# Patient Record
Sex: Male | Born: 2000
Health system: Southern US, Community
[De-identification: ages and names within clinical notes are randomized; demographics above are authoritative.]

---
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
## 2019-02-25 DIAGNOSIS — Z1159 Encounter for screening for other viral diseases: Secondary | ICD-10-CM | POA: Diagnosis not present

## 2019-04-09 DIAGNOSIS — N50811 Right testicular pain: Secondary | ICD-10-CM | POA: Diagnosis not present

## 2019-04-09 DIAGNOSIS — N50819 Testicular pain, unspecified: Secondary | ICD-10-CM | POA: Diagnosis not present

## 2019-04-09 DIAGNOSIS — N453 Epididymo-orchitis: Secondary | ICD-10-CM | POA: Diagnosis not present

## 2019-04-09 DIAGNOSIS — N452 Orchitis: Secondary | ICD-10-CM | POA: Diagnosis not present

## 2019-06-21 DIAGNOSIS — M79644 Pain in right finger(s): Secondary | ICD-10-CM | POA: Diagnosis not present

## 2019-06-21 DIAGNOSIS — S63619A Unspecified sprain of unspecified finger, initial encounter: Secondary | ICD-10-CM | POA: Diagnosis not present

## 2019-08-07 DIAGNOSIS — Z7251 High risk heterosexual behavior: Secondary | ICD-10-CM | POA: Diagnosis not present

## 2019-08-07 DIAGNOSIS — Z114 Encounter for screening for human immunodeficiency virus [HIV]: Secondary | ICD-10-CM | POA: Diagnosis not present

## 2019-08-07 DIAGNOSIS — Z1159 Encounter for screening for other viral diseases: Secondary | ICD-10-CM | POA: Diagnosis not present

## 2019-08-07 DIAGNOSIS — Z202 Contact with and (suspected) exposure to infections with a predominantly sexual mode of transmission: Secondary | ICD-10-CM | POA: Diagnosis not present

## 2019-09-01 DIAGNOSIS — B3742 Candidal balanitis: Secondary | ICD-10-CM | POA: Diagnosis not present

## 2019-09-01 DIAGNOSIS — A749 Chlamydial infection, unspecified: Secondary | ICD-10-CM | POA: Diagnosis not present

## 2019-09-05 ENCOUNTER — Ambulatory Visit: Payer: Self-pay | Attending: Internal Medicine

## 2019-09-05 DIAGNOSIS — Z23 Encounter for immunization: Secondary | ICD-10-CM | POA: Insufficient documentation

## 2019-09-05 NOTE — Progress Notes (Signed)
   Covid-19 Vaccination Clinic  Name:  ESCO JOSLYN    MRN: [id number] DOB: [birth year]  09/05/2019  Mr. Bauer was observed post Covid-19 immunization for 15 minutes without incidence. He was provided with Vaccine Information Sheet and instruction to access the V-Safe system.   Mr. Printup was instructed to call 911 with any severe reactions post vaccine: Marland Kitchen Difficulty breathing  . Swelling of your face and throat  . A fast heartbeat  . A bad rash all over your body  . Dizziness and weakness    Immunizations Administered    Name Date Dose VIS Date Route   Pfizer COVID-19 Vaccine 09/05/2019  6:15 PM 0.3 mL 07/01/2019 Intramuscular   Manufacturer: [employer], [employer]   Lot: [id number]   NDC: [address]-6

## 2019-09-19 DIAGNOSIS — H52223 Regular astigmatism, bilateral: Secondary | ICD-10-CM | POA: Diagnosis not present

## 2019-09-27 ENCOUNTER — Ambulatory Visit: Payer: Self-pay | Attending: Internal Medicine

## 2019-09-27 DIAGNOSIS — Z23 Encounter for immunization: Secondary | ICD-10-CM | POA: Insufficient documentation

## 2019-09-27 NOTE — Progress Notes (Signed)
   Covid-19 Vaccination Clinic  Name:  BOSTEN NEWSTROM    MRN: [id number] DOB: May 23, 2001  09/27/2019  Mr. Carstens was observed post Covid-19 immunization for 15 minutes without incident. He was provided with Vaccine Information Sheet and instruction to access the V-Safe system.   Mr. Tortorella was instructed to call 911 with any severe reactions post vaccine: Marland Kitchen Difficulty breathing  . Swelling of face and throat  . A fast heartbeat  . A bad rash all over body  . Dizziness and weakness   Immunizations Administered    Name Date Dose VIS Date Route   Pfizer COVID-19 Vaccine 09/27/2019  3:49 PM 0.3 mL 07/01/2019 Intramuscular   Manufacturer: [employer], [employer]   Lot: [id number]   NDC: [address]-5

## 2019-09-28 ENCOUNTER — Ambulatory Visit: Payer: Self-pay

## 2020-02-17 ENCOUNTER — Ambulatory Visit (INDEPENDENT_AMBULATORY_CARE_PROVIDER_SITE_OTHER): Payer: 59 | Admitting: Psychologist

## 2020-02-17 ENCOUNTER — Encounter: Payer: Self-pay | Admitting: Psychologist

## 2020-02-17 ENCOUNTER — Other Ambulatory Visit: Payer: Self-pay

## 2020-02-17 DIAGNOSIS — F4320 Adjustment disorder, unspecified: Secondary | ICD-10-CM

## 2020-02-17 NOTE — Progress Notes (Signed)
Patient ID: KYROS SALZWEDEL, male   DOB: [birth year], 19 y.o.   MRN: [id number] Psychological intake 11:05 AM to 11:55 AM with mother via video conference.  Virtual Visit via Video Note  I connected with Demetre B Vacha's mother on 02/17/20 at 11:00 AM EDT by a video enabled telemedicine application and verified that I am speaking with the correct person using two identifiers.  Location: Patient: Home Provider: [facility] office   I discussed the limitations of evaluation and management by telemedicine and the availability of in person appointments. The patient expressed understanding and agreed to proceed.  History of Present Illness: Bradden is a 19 year old adolescent male who has struggled emotionally and academically since February/March last year.  Was a Printmaker at [employer], however, due to [employer] restrictions all classes were all online.  He struggled tremendously academically only passing classes for 6 credits out of 18 credit hours he attempted.  He withdrew for spring semester and attended [employer], initially taking 3 classes, all of which he dropped.  Mood has been variable ranging from mild dysphoria and anxiety to irritability.  Motivation, energy, effort have been diminished.  He plans to reenter all at [employer] for this coming fall.  There is a concern that he may be struggling with an underlying adjustment disorder and/or mild learning disorder.  Historically, he struggled with math and science classes in high school.  He graduated from [employer] where he attended for his 11th and 12th grade years after moving here from [place].  Brief medical history: Per mother, he is not currently on any medications.  She reported that he had not had any surgeries, hospitalizations, or head injuries.  She reported no known allergies to medications, foods, and fibers.  He does have mild environmental allergies that respond well to over-the-counter medication.  There is no  history of psychotherapy or psych testing.  No history of chronic illnesses.  Family medical history is positive for mood disorder, schizophrenia, and autistic spectrum disorder in second cousins only.  Mental status: Per mother, his typical mood is fairly happy-go-lucky, although lately his mood has been quite variable ranging from irritability to what she describes as borderline depressed and anxious.  Affect is described as broad and appropriate to mood.  Speech is described as goal-directed and the content as productive..  Thoughts are described as clear, coherent, relevant and rational.  Judgment and insight are described as variable relative to age.  There have been several instances of sneaking out in the middle of the night of the rendezvous with his new girlfriend.  Mother reported no history of or concerns about suicidal or homicidal ideation.  She reported no history of drug or alcohol use.  He does have a history of vaping in the past although it is believed that he no longer is a nicotine user.  Extracurriculars in the past that included basketball and tennis.  His original goal at [employer] was to be a premed major, although now he is thinking of changing that possibly public health.  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  Social relationships are described as restricted since his relationship with his girlfriend has blossomed.  Diagnoses: Adjustment disorder unspecified, rule out learning disorder    I discussed the assessment and treatment plan with the patient. The patient was provided an opportunity to ask questions and all were answered. The patient agreed with the plan and demonstrated an understanding of  the instructions.   The patient was advised to call back or seek an in-person evaluation if the symptoms worsen or if the condition fails to improve as anticipated.  I provided 45 minutes of non-face-to-face time during this encounter.   Beatrix Fetters, PhD

## 2020-02-20 ENCOUNTER — Other Ambulatory Visit: Payer: Self-pay

## 2020-02-20 ENCOUNTER — Ambulatory Visit (INDEPENDENT_AMBULATORY_CARE_PROVIDER_SITE_OTHER): Payer: 59 | Admitting: Psychologist

## 2020-02-20 ENCOUNTER — Encounter: Payer: Self-pay | Admitting: Psychologist

## 2020-02-20 DIAGNOSIS — F4320 Adjustment disorder, unspecified: Secondary | ICD-10-CM

## 2020-02-20 NOTE — Progress Notes (Signed)
Patient ID: Paul Rosario, male   DOB: November 17, 2000, 19 y.o.   MRN: [id number] Psychological testing 1:30 PM to 4:30 PM +1-hour for scoring.  Administered the Wechsler Adult Intelligence Scale-four, Paul Rosario did a reading test, the portions of the Woodcock-Johnson achievement battery.  I will complete the evaluation tomorrow and provide feedback and recommendations to patient and parents.  Diagnoses: Adjustment disorder unspecified, rule out learning disorder

## 2020-02-21 ENCOUNTER — Encounter: Payer: Self-pay | Admitting: Psychologist

## 2020-02-21 ENCOUNTER — Ambulatory Visit (INDEPENDENT_AMBULATORY_CARE_PROVIDER_SITE_OTHER): Payer: 59 | Admitting: Psychologist

## 2020-02-21 DIAGNOSIS — F4320 Adjustment disorder, unspecified: Secondary | ICD-10-CM

## 2020-02-21 NOTE — Progress Notes (Signed)
Patient ID: Paul Rosario, male   DOB: 07-23-2000, 19 y.o.   MRN: [id number] Psychological testing 9 AM to 11 AM +2 hours for report. Completed the Woodcock-Johnson achievement test battery, Wide Range Assessment of Memory and Learning, ADHD and mood rating scales, and Conners continuous performance test. I will conference with patient and parents to discuss results and recommendations.  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

## 2020-02-21 NOTE — Progress Notes (Addendum)
Patient ID: Paul Rosario, male   DOB: 2000/10/10, 19 y.o.   MRN: [id number] Psychological testing feedback session 11 AM to 11:50 AM with patient and mother. Discussed results of the psychological evaluation. In the Wechsler Adult Intelligence Scale-4, Paul Rosario performed in the superior range of intellectual functioning and at the 91st percentile. His academic skills are at levels consistent with intellectual aptitude. ADHD and mood rating scales were all in the nonclinical range. However, he did display global mild neurodevelopmental dysfunctions in auditory memory, visual memory, and working memory. Numerous recommendations and accommodations were discussed. A report will be prepared that can be shared with the appropriate school personnel.  Diagnoses: Adjustment disorder unspecified, mild neurodevelopmental dysfunctions and memory         PSYCHOLOGICAL EVALUATION  NAME:   Paul Rosario  DATE OF BIRTH:   02-Oct-2000 AGE:   19 years, 7 months  GRADE:   Rising college sophomore  DATES EVALUATED:   02-20-20, 02-21-20 EVALUATED BY:   Beatrix Fetters. Mark Saniya Tranchina, Ph.D.   MEDICAL RECORD NO.: [id number]   REASON FOR REFERRAL:   Saxton was referred for an evaluation of his cognitive, intellectual, academic, memory, and attention strengths/weaknesses to aid in academic planning.  The reader interested in more background information is referred to the medical record where there is a comprehensive developmental database.  BASIS OF EVALUATION: Wechsler Adult Intelligence Scale-IV Woodcock-Johnson IV Tests of Achievement Nelson-Denny Reading Test Form J Wide-Range Assessment of Memory and Learning-II Conners Continuous Performance Test-3 ADHD, Depression, and Anxiety Rating Scales   RESULTS OF THE EVALUATION: On the Wechsler Adult Intelligence Scale-Fourth Edition (WAIS-IV), Paul Rosario achieved a General Ability Index standard score of 120 and a percentile rank of 91.  These data indicate that he is currently functioning in  the superior range of intelligence.  The General Ability Index is deemed the most valid and reliable indicator of [name]'s current level of intellectual functioning given the scatter among the individual indices.  Raine's index scores and scaled scores are as follows:   Domain                        Standard Score    Percentile Rank Verbal Comprehension Index          120 91 Perceptual Reasoning Index             113 81 Working Memory Index                   105 63 Processing Speed Index                      94 34 Full Scale IQ                                     112 79 General Ability Index                        120 91   Verbal    Comprehension Subtests Scaled Score             Similarities 13  Vocabulary 13  Information 13   Working    MusicianMemory Subtests  Scaled Score               Digit Span 8  Arithmetic  14   Perceptual  Reasoning Subtests  Scaled Score Block Design  14 Matrix Reasoning 12  Processing  Speed Subtests  Scaled Score Coding 8 Symbol Search 10  * Please note, all scaled scores have a mean of 10 and a standard deviation of three.    On the Verbal Comprehension Index, Paul Rosario performed in the superior range of intellectual functioning and at the 91st percentile.  Overall, he displayed an exceptional ability to access and apply acquired knowledge.  He was able to verbalize meaningful concepts, think about verbal information, and express himself using words with complete ease.  Paul Rosario's high scores in this domain are indicative of a superior verbal reasoning system with strong word knowledge acquisition, effective information retrieval, superior ability to reason and solve verbal problems, and effective communication of knowledge.  Paul Rosario performed comparably across all three subtests from this domain, indicating that this verbal abstract reasoning skills, word knowledge/vocabulary skills, and fund of general knowledge/long-term memory for factual information are all similarly  well developed at this time.    On the Perceptual Reasoning Index, Paul Rosario performed in the above average range of intellectual functioning and at the 81st percentile.  Overall, he displayed a well developed ability to evaluate visual details and understand visual spatial relationships.  The data indicate that he has above average broad visual intelligence, abstract visual thinking capacity, and ability to apply spatial reasoning and analyze visual details.  Paul Rosario performed comparably across the different subtests from this domain, indicating that his visual spatial reasoning ability is similarly well developed, whether solving visual problems that involve a motor response, or solving visual problems with unique stimuli that must be solved mentally.    On the Working Memory Index, Paul Rosario performed in the average range of functioning and at the 63rd percentile.  However, his performance across the different subtests in this domain was extremely discrepant and diagnostically important.  For example, Paul Rosario performed in the superior range of functioning and at the 91st percentile, on the arithmetic subtest, which measures his ability to answer basic mathematical word problems in his head.  On the other hand, Paul Rosario displayed a mild neurodevelopmental dysfunction, and functional limitation/deficit, at the very lowest end of the average range of functioning, and at only the 25th percentile, in his ability to register, maintain, and manipulate auditory information in conscious awareness.  Paul Rosario struggled to remember one piece of verbal information while performing a second mental or cognitive task.  Paul Rosario's auditory working memory skills are one of his weakest areas of cognitive development.    On the Processing Speed Index, Paul Rosario performed toward the lowest end of the average range of functioning and at only the 34th percentile.  He displayed a mild functional deficit in his speed and accuracy in visual identification,  decision making, and decision implementation.  While Paul Rosario's mental/cognitive processing skills were within the broad parameters of average, they represent one of his weakest areas of cognitive development and should be considered at least a relative area of weakness.  Dajon was inconsistent in his ability to rapidly identify, register, and implement decisions under time pressures, particularly when there was a graphomotor component.    On the General Ability Index, Binyamin performed in the superior range of intellectual functioning and at the 91st percentile.  The General Ability Index provides an estimate of overall intelligence that is less impacted by working memory and processing speed, especially relative to the Full Scale IQ score.  The General Ability Index consists of subtests from the verbal comprehension and perceptual reasoning domains.  Nakota's high General Ability Index  scores indicate superior abstract, conceptual, visual perceptual and spatial reasoning, as well as verbal problem solving ability.  There was a significant difference between Bartley's General Ability Index and Full Scale IQ scores indicating that the effects of working memory and cognitive processing speed definitely led to his relatively lower overall Full Scale IQ.  That is, the estimate of Kalum's intellectual ability was lowered by the inclusion of working memory and processing speed subtests.  These data further support the conclusion that his higher-order cognitive abilities are a distinct area of strength, while his working memory and processing speed skills are specific areas of weakness.    On the Woodcock-Johnson IV Tests of Achievement, Nox achieved the following scores using norms based on his age:         Standard Score  Percentile Rank Basic Reading Skills 109 72    Letter-Word Identification 105 64    Word Attack 112 80  Reading Comprehension Skills 112 78   Passage Comprehension 109 74   Reading Recall   111 76  Math Calculation Skills 119 90   Calculation 115 84   Math Facts Fluency 118 88  Math Problem Solving 123 94   Applied Problems 128 97   Number Matrices 114 83  Written Expression 124 95   Writing Samples 138 99   Sentence Writing Fluency 96 39  Academic Fluency 108 71    Sentence Reading Fluency 104 59    Math Facts Fluency 118 89    Sentence Writing Fluency 96 39  On the reading portion of the achievement test battery, Danielle performed overall in the above average range of functioning and well above age and grade level.  For example, he displayed above average word decoding skills (sight word recognition, phonological processing), reading comprehension and reading recall.  Yardley did display one mild weakness, albeit still solidly average, and on age and grade level, in his reading processing speed/fluency.  To further assess Weylin's reading comprehension abilities under time pressures, the Nelson-Denny Reading Test, Form J was administered.  On this test, Breton achieved a Reading Comprehension standard score of 115 and a percentile rank of 84, and a Reading Rate standard score of 83 and a percentile rank of 33.  These data indicate while that Vayden's reading processing speed/fluency is slow, his comprehension is well above average.     On the math portion of the achievement test battery, Elvis performed for the most part in the superior range of functioning and substantially above age and grade level.  Nnamdi displayed an excellent knowledge of basic math facts and basic calculation skills.  Further, Romulo displayed a strength, in the superior to very superior range of functioning, in his math reasoning ability.  He intuitively understands math concepts at an exceptionally high level.  He was able to deconstruct multioperational word problems with ease and generalize math concepts with ease.    On the written language portion of the achievement test battery, Dillinger's performance across  the two subtests was quite discrepant.  On the one hand, when there were no time pressures, Qasim displayed very superior and gifted writing composition ability.  His compositions were thoughtful, cogent, comprehensive, and filled with creative detail.  On the other hand, Tremaine displayed a mild neurodevelopmental dysfunction, toward the lower end of the average range of functioning, and a full 4+ grade levels behind (grade equivalent 8.8) in his writing processing speed/fluency.  It does take Gerson significantly longer to write under time pressures than one  would expect given his intellectual aptitude.     On the Wide-Range Assessment of Memory and Learning-II, Froilan achieved the following scores:   Verbal Memory Standard Score: 94  Percentile Rank: 34   Visual Memory Standard Score: 100  Percentile Rank: 50  These data indicate that while Therman's overall auditory and visual memory skills are in the average range of functioning, they are well below his intellectual aptitude, and should be considered areas of weakness.  In the auditory realm, Aydon was very inconsistent in his ability to remember details from stories and word lists that were read to him.  In the visual realm, Hadi was very inconsistent with both his visual recall and visual recognition memory.  Further, as previously noted, Aristeo displayed a mild neurodevelopmental dysfunction in his auditory working memory.  To maximize his cognitive, intellectual, and academic potential, Spyros is going to need to learn and utilize specific study and memory strategies to compensate for this relative weakness.    Results from the Conners Continuous Performance Test were in the nonclinical range of functioning.  Winson did not have any atypical T-scores and the results do not suggest that he has a disorder characterized by attention deficits, such as ADHD.  Further, results from the ADHD Rating Scales indicate that Tricia did not meet any of the inattention  of hyperactive/impulsive subtype criteria.    Results from the Depression and Anxiety Rating Scales were in the nonclinical range of functioning.  The data do not suggest that Broady is struggling with any clinically significant issues with depression or anxiety at this time.    SUMMARY: In summary, the data indicate that Thelmer is a young man of superior intellectual aptitude.  Overall, he displayed well developed abstract, conceptual, visual perceptual and spatial reasoning, as well as verbal problem solving ability.  Academically, the data, for the most part, indicate that Linc is performing substantially above both age and grade level.  He displayed strengths, in the above average range of functioning, in his basic reading skills, reading comprehension ability, and basic calculation skills.  Further, he displayed superior to very superior math reasoning ability, and very superior writing composition skills.  Results from the ADHD, depression and anxiety rating scales do not indicate that Niraj is struggling with any clinically significant issues in these areas at this time.  On the other hand, the data do yield several areas of at least mild concern.  First, Coltyn displayed mild neurodevelopmental dysfunctions in his memory skills.  His auditory working auditory, Engineer, production, visual recognition, and visual recall memory skills are all inconsistent and well below intellectual functioning.  Second, Carlyle displayed a mild neurodevelopmental dysfunction, and mild functional limitation in his cognitive processing speed.  Finally, Lucky displayed a mild neurodevelopmental dysfunction in his writing processing speed/fluency.    DIAGNOSTIC CONCLUSIONS: 1. Superior Intelligence.   2. 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  3. Mild neurodevelopmental dysfunction in processing speed and writing fluency.  4. Academic skills consistent with intellectual aptitude.  RECOMMENDATIONS:   1. It  is recommended that the results of this evaluation be shared with Demitrios's academic team so that they are aware of the pattern of his cognitive, intellectual, academic, and memory strengths/weaknesses.  Given the constellation of Chen's mild neurodevelopmental dysfunctions in memory, cognitive processing speed, and writing processing speed/fluency, it is recommended that he receive extended time on tests as necessary, a set of class/lecture notes, access to digital technology (laptop or similar device, Smart Pen, voice to text  capacity, etc.).    2. Following are general suggestions regarding Avrum's mild functional deficits in memory:  A. Zayvien should use mnemonic strategies to help improve his memory skills.  For example, he should remember information via imagery, rhymes, anagrams, or subcategorization.   B. It is recommended that Jonpaul be allowed to record his classroom lectures.  He could then relisten to the academic lecture at home to help supplement any gaps in his knowledge that might have occurred due to his memory weaknesses.  The [employer] Pen is an excellent option.  C. It is important that Hermilo study in a quiet environment with a minimal amount of noise and distractions present.  He should not study in situations where music is playing, the TV is on, or other people are talking nearby.    D. Other study/memory strategies to be utilize:  1.  Complete all assignments.  This includes not just doing and turning in the  homework but also reading all the assigned text.  Homework assignments are a professor's gift to students, a free grade.  Do not give away free grades.   2.   Spend minimum of 15-30 minutes reviewing notes for each class per day.                       3.   In class, sit near the front.  This reduces distractions and increases    attention.                            4.   For tests be selective and study in depth.  Spend a minimum of 1-2 hours    daily reviewing  your test material starting 3 days before each test.                E. Maximize your memory:  Following are memory techniques:  . To improve memory increases the number of rehearsals and the input channels.  For example, get in the habit of Hearing the information, Seeing the information, Writing the information, and Explaining out loud that information.  . Over learn information.  . Make mental links and associations of all materials to existing knowledge so that you give the new material context in your mind.  . Systemize the information.  Always attempt to place material to be learned in some form of pattern.  Create a system to help you recall how information is organized and connected.  . Review is key.  Review very soon after the original learning and then space out additional review periods farther apart.  The best time to review is just as you are about to forget, but can still just remember.   F. Time Management:  Always stop studying at a reasonable hour (i.e.:  10-11    p.m.).  It is important that Timithy study for 20-45 minutes at a time then take a 5-   10 minute break.  3. Following are general study strategies to help Izrael maximize his cognitive, intellectual, and academic potential:   A. The best way to begin any reading assignment is to skim the pages to get an  overall view of what information is included.  Then read the text carefully, word for word, and highlight the text and/or take notes in your notebook.                B. Thoma should participate actively while  reading and studying.  For example, he needs to acquire the habit of writing while he reads, learning to underline, to circle key words, to place an asterisk in the margin next to important details, and to inscribe comments in the margins when appropriate.  These habits over time will help Delwyn read for content and should improve his comprehension and recall.     C. Alonzo should practice reading by breaking up  paragraphs into specific meaningful  components.  For example, he should first read a paragraph to discern the main idea, then, on a separate sheet of paper, he should answer the questions who, what, where, when, and why.  Through this type of practice, Fitzroy should be able to learn to read and select salient details in passages while being able to reject the less relevant content details.  Additionally, it should help him to sequence the passage ideas or events into a logical order and help him differentiate between main ideas and supporting data.  Once Able has completed the process mentioned above, he should then practice re-telling and re-thinking the passage and its meaning into his own words.     D. In order to improve his comprehension, Qasim is encouraged to use the    following reading/study skills:  1. Before reading a passage or chapter, first skim the chapter heading and bold face material to discern the general gist of the material to be read.  2. Before reading the passage or chapter, read the end-of-chapter questions to determine what material the authors believe is important for the student to remember.  Next, write those questions down on a separate piece of paper to be answered while reading.    E. When reading to study for an examination, Deavon needs to develop a deliberate    memory plan by considering questions such as the following:    1. What do I need to read for this test?  2. How much time will it take for me to read it?  3. How much time should I allow for each chapter section?  4. Of the material I am reading, what do I have to memorize?  5. What techniques will I use to allow materials to get into my memory?  This is where underlining, writing comments, or making charts and diagrams can strengthen reading memory.  6. What other tricks can I use to make sure I learn this material:  Should I use a tape recorder?  Should I try to picture things in my mind?  Should I  use a great deal of repetition?  Should I concentrate and study very hard just before I go to sleep?    7. How will I know when I know?  What self-testing techniques can I use to test my knowledge of the material?   F. It is recommended that Nickolis use a multicolored highlighter to highlight  material.  For example, he could highlight main ideas in yellow, names and dates in green, and supporting data in pink.  This technique provides visual cues to aid with memory and recall.       G. READING MARGIN NOTES:        1. Underline important ideas you want to remember, and then write a key   word or draw a picture or symbol in the margin.  You should also underline and then write "Main Idea" or "MI" in margin.      2. Write a note or draw a picture or  diagram in the margin that describes the   organizational structure the Thereasa Parkin uses such as:  cause/effect, compare/contrast, temporal/sequential order.      3. Write numbers beside supporting details in the text and in the margin write       "SD" and the corresponding number, i.e., SD-1, SD-2, etc.      4. Write "EX" in the margin to indicate when the Thereasa Parkin gives examples of       main ideas.      5. Circle unknown words and terms and write definition in margin.      6. Write any ideas or questions you have about the subject in the margin.        Relating information in the text to what you already know and your own       experience helps you understand and remember.      7. Star or otherwise emphasize ideas or facts in the text that your teacher       talks about in class.  These are likely to be used in test questions.      8. Put a question mark beside any parts of the text or ideas which you have   trouble understanding as a reminder to ask about them or look up more information.      9. Whether you write words or draw pictures or symbols does not matter.        The purpose is to remind you what is important and/or what needs further    clarification.  Use the system that works best for you.  It will help to be consistent and use the same system for all subjects.    1. Do not go on to the next chapter or section until you have completed the following exercise:  2. Write definitions of all key terms.  3. Summarize important information in your own words.  4. Write any questions that will need clarification with the teacher.   H. Read With a Plan:  Zephan's plan should incorporate the following:   1. Learn the terms.   2. Skim the chapter.   3. Do a thorough analytical reading.   4. Immediately upon completing your thorough reading, review.   5. Write a brief summary of the concepts and theories you need to    remember.     I. Karandeep should use [employer] One Note to record his homework assignments   for each class.  He should notate that he completed each assignment and that he put each assignment in its proper place to be turned in on time.  J. Know the Professors:  Nyshawn should make an effort to understand each  professor's approach to their subjects, their expectations, standards, flexibility, etc.  Essentially, Reda should compile a mental profile of each professor and be able to answer the questions:  What does this professor want to see in terms of notes, level of participation, papers, projects?  What are the professor's likes and dislikes?  What are the professor's methods of grading and testing?, etc.  K. Note Taking:  Orpheus should compile notes in two different arenas.  First, Tobyn should take notes from his textbooks.  Working from his books at home or in [employer], Murle should identify the main ideas, rephrase information in his own words, as well as capture the details in which he is unfamiliar.  He should take brief, concise notes in a separate computer notebook for each class.  Second, in class, Sevyn should take notes that sequentially follow the professor's lecture pattern.  When class is complete,  Charleton should review his notes at the first opportunity.  He can then fill in any gaps or missing information either by tracking down that information from the textbook, from the professor, or utilizing a copy of professor notes.  L. Organize Your Time:  While it is important to specifically structure study time,  it is just as important to understand that one must study when one can and study whenever circumstances allow.  Initially, always identify those items on your daily calendar, that can be completed in 15 minutes or less.  These are the items that could be set aside to be completed during lunch, between text messages, etc.  It is recommended that Gerell use two tools for his daily planning organization.  First is [employer] One Note.  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  On the project board, Nosson should schedule all of his long-term projects, papers, and scheduled tests/exams.  One important trick, when scheduling the due dates, it is recommended that Kawon always schedule the completion date at least 2-3 days prior to the actual turn in date so as to give Kendrell a cushion for life circumstances as they arise.  With each paper, test and long term project then work backwards on the project board filling in what needs to be done week by week until completion (i.e.:  first draft, second draft, proofing, final draft and turn in).              M. The amount you learn, or the amount you write is directly related to the amount of   time you spend doing it.  If you want to be successful (maximize your grades, for instance), you will need to set aside time to work.  Following are some fundamentals of effective study:    1. Create a good and inviting work environment.  Try to keep a specific place  to study, make it appealing in your own way, and keep it clear of clutter and distractions.     2. Make a list beforehand of what you  are going to work on.  List what you  are going to do, in what order you are going to do them, and the amount of time you plan to spend on each.  You can make "game time" changes as needed.    3. Keep the benefits of your study clearly in mind.  Remind yourself what the     end goal is and how this study moment contributes to it.    4. Always leave your study environment organized for the next session.  Put     papers, notes, and books where they should go.    5. Study in short periods.  Spend between 20-45 minutes at a time and then  take a short 5-10 minute break.  Use a timer to keep track of both your work time and rest time.    6. Divide big projects into individual smaller and manageable tasks.  Focus     on the demands of each smaller task one at a time.    N. Learn to be a good note taker.  Notetaking helps you organize the material,   increases your understanding and remembering of the material, and allows you to put information into your own  words.      1. Taking lecture notes and notes on what you read helps you concentrate     and stay focused.  It keeps you actively engaged.      2. Taking notes helps you to more easily remember the material.    3. Notetaking might include notes written in a linear fashion, the underlining  or highlighting of key points, making comments in the margins, the drawing of pictures/diagrams/graphs or spider diagrams.      4. It is always useful, as you get close to the exam, to rewrite and condense   your notes.  Essentially, make notes of your notes.  This helps you to rehearse the material, process the material, retrieve the material, all of which makes the information more readily accessible and easier to recall.     O. Good study habits, a motivation to learn, and a positive attitude are key factors in   determining the success or the lack of success of one's educational pursuits.  Learn to avoid some of the common roadblocks to academic  success:    1.  Lack of Discipline - One must learn to continue working toward their     goals, even when it is difficult, or stressful, or boring.  Get in the habit of  doing what you need to do, when you need to do it to the best of your ability, whether or not you like it or enjoy it.  Learn to get comfortable feeling uncomfortable.      2. Lack of Passion/Motivation - Motivation follows action.  Get busy and the  motivation will follow.  Create an image in your head of how you will feel when your goal is accomplished.      3. Lack of Focus - To counter focus issues, make a plan or a list that outlines     all the necessary steps to complete your task.  Now just complete one task     at a time until the job is done.  Knowing the steps makes the task easier.     4. Lack of Accountability - Be accountable to yourself.  Reward yourself  with task completion, withhold the reward for non-completion.  Share your goal, plan with someone else.  Sometimes it is harder to let someone else down than yourself.     5. Lack of Time - Practice breaking down tasks into 25-45 minute  intervals/segments and take a short break in between.  Shorter work spurts increase productivity.      6. Too Many Negative Thoughts - Learn how to identify those negative  thoughts that diminish productivity and work ethic.  Confront and replace them with more successful outcome thoughts.     P. Test Taking Strategies:    1. Read through the whole test first.    2. Notice how many points each part of the test is worth.    3. Write down any specific formulas, principals, ideas or other details you     have memorized in the margins.    4. Answer the easiest questions first.    5. Answer all the objective parts first (these often give you clues for the     essay questions).    6. Answer the essay questions last.  Use a mind map to help organize your     ideas.     As always, this examiner is available to consult  in the future as needed.  Respectfully,    RJolene Provost, Ph.D.  Licensed Psychologist Clinical Director [facility]  RML/tal

## 2020-04-22 DIAGNOSIS — R509 Fever, unspecified: Secondary | ICD-10-CM | POA: Diagnosis not present

## 2020-04-23 ENCOUNTER — Other Ambulatory Visit: Payer: Self-pay

## 2020-04-23 ENCOUNTER — Encounter (HOSPITAL_COMMUNITY): Payer: Self-pay | Admitting: *Deleted

## 2020-04-23 ENCOUNTER — Ambulatory Visit (HOSPITAL_COMMUNITY)
Admission: EM | Admit: 2020-04-23 | Discharge: 2020-04-23 | Disposition: A | Discharge status: Home / Self Care | Payer: 59 | Attending: Family Medicine | Admitting: Family Medicine

## 2020-04-23 ENCOUNTER — Ambulatory Visit (INDEPENDENT_AMBULATORY_CARE_PROVIDER_SITE_OTHER): Payer: 59

## 2020-04-23 ENCOUNTER — Encounter: Payer: Self-pay | Admitting: Family Medicine

## 2020-04-23 ENCOUNTER — Telehealth (INDEPENDENT_AMBULATORY_CARE_PROVIDER_SITE_OTHER): Payer: 59 | Admitting: Family Medicine

## 2020-04-23 DIAGNOSIS — R21 Rash and other nonspecific skin eruption: Secondary | ICD-10-CM

## 2020-04-23 DIAGNOSIS — R110 Nausea: Secondary | ICD-10-CM | POA: Diagnosis not present

## 2020-04-23 DIAGNOSIS — R509 Fever, unspecified: Secondary | ICD-10-CM | POA: Diagnosis not present

## 2020-04-23 DIAGNOSIS — B349 Viral infection, unspecified: Secondary | ICD-10-CM

## 2020-04-23 LAB — CBC
HCT: 42.3 % (ref 39.0–52.0)
Hemoglobin: 13.6 g/dL (ref 13.0–17.0)
MCH: 24.3 pg — ABNORMAL LOW (ref 26.0–34.0)
MCHC: 32.2 g/dL (ref 30.0–36.0)
MCV: 75.7 fL — ABNORMAL LOW (ref 80.0–100.0)
Platelets: 108 10*3/uL — ABNORMAL LOW (ref 150–400)
RBC: 5.59 MIL/uL (ref 4.22–5.81)
RDW: 12.7 % (ref 11.5–15.5)
WBC: 2.9 10*3/uL — ABNORMAL LOW (ref 4.0–10.5)
nRBC: 0 % (ref 0.0–0.2)

## 2020-04-23 LAB — POCT URINALYSIS DIPSTICK, ED / UC
Bilirubin Urine: NEGATIVE
Glucose, UA: NEGATIVE mg/dL
Ketones, ur: NEGATIVE mg/dL
Leukocytes,Ua: NEGATIVE
Nitrite: NEGATIVE
Protein, ur: NEGATIVE mg/dL
Specific Gravity, Urine: 1.01 (ref 1.005–1.030)
Urobilinogen, UA: 0.2 mg/dL (ref 0.0–1.0)
pH: 7 (ref 5.0–8.0)

## 2020-04-23 LAB — CBC WITH DIFFERENTIAL/PLATELET
Abs Immature Granulocytes: 0.03 10*3/uL (ref 0.00–0.07)
Basophils Absolute: 0 10*3/uL (ref 0.0–0.1)
Basophils Relative: 0 %
Eosinophils Absolute: 0 10*3/uL (ref 0.0–0.5)
Eosinophils Relative: 0 %
HCT: 42.4 % (ref 39.0–52.0)
Hemoglobin: 13.8 g/dL (ref 13.0–17.0)
Immature Granulocytes: 1 %
Lymphocytes Relative: 18 %
Lymphs Abs: 0.5 10*3/uL — ABNORMAL LOW (ref 0.7–4.0)
MCH: 24.4 pg — ABNORMAL LOW (ref 26.0–34.0)
MCHC: 32.5 g/dL (ref 30.0–36.0)
MCV: 75 fL — ABNORMAL LOW (ref 80.0–100.0)
Monocytes Absolute: 0.2 10*3/uL (ref 0.1–1.0)
Monocytes Relative: 5 %
Neutro Abs: 2.2 10*3/uL (ref 1.7–7.7)
Neutrophils Relative %: 76 %
Platelets: 125 10*3/uL — ABNORMAL LOW (ref 150–400)
RBC: 5.65 MIL/uL (ref 4.22–5.81)
RDW: 12.7 % (ref 11.5–15.5)
WBC: 2.9 10*3/uL — ABNORMAL LOW (ref 4.0–10.5)
nRBC: 0 % (ref 0.0–0.2)

## 2020-04-23 LAB — POCT RAPID STREP A, ED / UC: Streptococcus, Group A Screen (Direct): NEGATIVE

## 2020-04-23 MED ORDER — ACETAMINOPHEN 325 MG PO TABS
ORAL_TABLET | ORAL | Status: AC
  Filled 2020-04-23: qty 2

## 2020-04-23 MED ORDER — ACETAMINOPHEN 325 MG PO TABS
650.0000 mg | ORAL_TABLET | Freq: Once | ORAL | Status: AC
  Administered 2020-04-23: 650 mg via ORAL

## 2020-04-23 MED ORDER — ONDANSETRON HCL 4 MG PO TABS: 4.0000 mg | ORAL_TABLET | Freq: Three times a day (TID) | ORAL | 0 refills | Status: AC | PRN

## 2020-04-23 NOTE — ED Provider Notes (Signed)
[facility]    CSN: [id number] Arrival date & time: 04/23/20  1303      History   Chief Complaint Chief Complaint  Patient presents with  . Rash  . Fever  . Chills  . Headache    HPI Paul Rosario is a 19 y.o. male.   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  He is Covid vaccinated.  He has had a fever for 4 days.  Fever and chills at home.  Slight headache and body aches.  Decreased appetite.  No nausea, no vomiting, no diarrhea.  No cough, sore throat, runny nose or respiratory symptoms.  No loss of taste or smell.  He went to an urgent care yesterday, and they did Covid testing and flu testing that were both negative.  They sent him home told him he had a virus.  Father was dissatisfied because he did not get any other testing or physical examination.  No real explanation for his fever. Today he comes in with a temperature of 101.6.  He feels tired, slight headache.  Again no other symptoms.  He developed a rash today.  It does not itch  History reviewed. No pertinent past medical history.  There are no problems to display for this patient.   History reviewed. No pertinent surgical history.     Home Medications    Prior to Admission medications   Medication Sig Start Date End Date Taking? Authorizing Provider  ondansetron (ZOFRAN) 4 MG tablet Take 1 tablet (4 mg total) by mouth every 8 (eight) hours as needed for nausea or vomiting. 04/23/20   [place], Betty G, MD    Family History Family History  Problem Relation Age of Onset  . Healthy Mother   . Healthy Father     Social History Social History   Tobacco Use  . Smoking status: Never Smoker  . Smokeless tobacco: Never Used  Vaping Use  . Vaping Use: Former  . Quit date: 11/22/2019  Substance Use Topics  . Alcohol use: Never  . Drug use: Not Currently    Types: Marijuana    Comment: a while ago     Allergies   Amoxicillin   Review of  Systems Review of Systems See HPI  Physical Exam Triage Vital Signs ED Triage Vitals  Enc Vitals Group     BP 04/23/20 1607 138/78     Pulse Rate 04/23/20 1607 (!) 105     Resp 04/23/20 1607 16     Temp 04/23/20 1607 (!) 101.6 F (38.7 C)     Temp Source 04/23/20 1607 Oral     SpO2 04/23/20 1607 98 %     Weight --      Height --      Head Circumference --      Peak Flow --      Pain Score 04/23/20 1604 7     Pain Loc --      Pain Edu? --      Excl. in GC? --    No data found.  Updated Vital Signs BP 138/78 (BP Location: Right Arm)   Pulse (!) 105   Temp (!) 101.6 F (38.7 C) (Oral)   Resp 16   SpO2 98%      Physical Exam Constitutional:      General: He is not in acute distress.    Appearance: He is well-developed and normal weight. He is ill-appearing.  HENT:  Head: Normocephalic and atraumatic.     Right Ear: Tympanic membrane, ear canal and external ear normal.     Left Ear: Tympanic membrane, ear canal and external ear normal.     Nose: Nose normal. No congestion.     Mouth/Throat:     Mouth: Mucous membranes are moist.     Comments: Slight erythema of posterior pharynx Eyes:     Conjunctiva/sclera: Conjunctivae normal.     Pupils: Pupils are equal, round, and reactive to light.     Comments: No conjunctival injection  Cardiovascular:     Rate and Rhythm: Normal rate and regular rhythm.     Heart sounds: Normal heart sounds.  Pulmonary:     Effort: Pulmonary effort is normal. No respiratory distress.     Breath sounds: Normal breath sounds.     Comments: Heart and lung exam is normal Abdominal:     General: Abdomen is flat. There is no distension.     Palpations: Abdomen is soft.     Comments: No mass or organomegaly  Musculoskeletal:        General: Normal range of motion.     Cervical back: Normal range of motion and neck supple.  Lymphadenopathy:     Cervical: No cervical adenopathy.  Skin:    General: Skin is warm and dry.     Comments:  Skin is warm and damp.  Faint erythematous macular rash present both arms chest and back.  Lower extremities not examined.  See photo  Neurological:     General: No focal deficit present.     Mental Status: He is alert.  Psychiatric:        Mood and Affect: Mood normal.        Behavior: Behavior normal.        UC Treatments / Results  Labs (all labs ordered are listed, but only abnormal results are displayed) Labs Reviewed  CBC - Abnormal; Notable for the following components:   All other components within normal limits  CBC WITH DIFFERENTIAL/PLATELET - Abnormal; Notable for the following components:   WBC 2.9 (*)    MCV 75.0 (*)    MCH 24.4 (*)    Platelets 125 (*)    Lymphs Abs 0.5 (*)    All other components within normal limits  POCT URINALYSIS DIPSTICK, ED / UC - Abnormal; Notable for the following components:   Hgb urine dipstick TRACE (*)    All other components within normal limits  CULTURE, GROUP A STREP [facility])  POCT RAPID STREP A, ED / UC   Strep test is negative. Urinalysis is negative. CBC shows leukopenia with a reassuring differential Chest x-ray is normal EKG   Radiology DG Chest 2 View  Result Date: 04/23/2020 CLINICAL DATA:  Fever, chills, nausea and rash. EXAM: CHEST - 2 VIEW COMPARISON:  None. FINDINGS: The cardiomediastinal contours are normal. The lungs are clear. Pulmonary vasculature is normal. No consolidation, pleural effusion, or pneumothorax. No acute osseous abnormalities are seen. IMPRESSION: Negative radiographs of the chest. Electronically Signed   By: Narda Rutherford M.D.   On: 04/23/2020 17:11    Procedures Procedures (including critical care time)  Medications Ordered in UC Medications  acetaminophen (TYLENOL) tablet 650 mg (650 mg Oral Given 04/23/20 1618)    Initial Impression / Assessment and Plan / UC Course  I have reviewed the triage vital signs and the nursing notes.  Pertinent labs & imaging results that were available  during my care of the  patient were reviewed by me and considered in my medical decision making (see chart for details).  Clinical Course as of Apr 23 1950  [facility] Apr 23, 2020  1716 CBC [YN]    Clinical Course User Index [YN] Eustace Moore, MD    *With patient permission I spoke with his mother.  I would discussed his testing.  I told her that all indicators point to a viral illness.  Not flu or Covid.  A number of other viruses can cause this picture.  His rash does look like a viral exanthem.  We will treat conservatively and follow Final Clinical Impressions(s) / UC Diagnoses   Final diagnoses:  Viral illness     Discharge Instructions      Chest x ray and lab testing is reassuring.  The white blood count is low because of the viral infection.  The CBC needs to be repeated in 2 weeks.  Strep test is negative.  By report Covid test and flu tests are negative as well This appears to be a viral infection.  Rest, fluids, Tylenol are the only treatments at this time likely to cause benefit.  Zofran may be taken for nausea. Appetite is poor but it is okay as long as he keeps drinking Need to follow-up if not improving in 72 hours.  Return sooner if worse instead of better at any time  Results for GREGOREY, NABOR (MRN [id number]) as of 04/23/2020 18:04  Ref. Range 04/23/2020 17:05 04/23/2020 17:17  WBC Latest Ref Range: 4.0 - 10.5 K/uL  2.9 (L)  RBC Latest Ref Range: 4.22 - 5.81 MIL/uL  5.65  Hemoglobin Latest Ref Range: 13.0 - 17.0 g/dL  42.8  HCT Latest Ref Range: 39 - 52 %  42.4  MCV Latest Ref Range: 80.0 - 100.0 fL  75.0 (L)  MCH Latest Ref Range: 26.0 - 34.0 pg  24.4 (L)  MCHC Latest Ref Range: 30.0 - 36.0 g/dL  76.8  RDW Latest Ref Range: 11.5 - 15.5 %  12.7  Platelets Latest Ref Range: 150 - 400 K/uL  125 (L)  nRBC Latest Ref Range: 0.0 - 0.2 %  0.0  Neutrophils Latest Units: %  76  Lymphocytes Latest Units: %  18  Monocytes Relative Latest Units: %  5  Eosinophil Latest  Units: %  0  Basophil Latest Units: %  0  Immature Granulocytes Latest Units: %  1  NEUT# Latest Ref Range: 1.7 - 7.7 K/uL  2.2  Lymphocyte # Latest Ref Range: 0.7 - 4.0 K/uL  0.5 (L)  Monocyte # Latest Ref Range: 0 - 1 K/uL  0.2  Eosinophils Absolute Latest Ref Range: 0 - 0 K/uL  0.0  Basophils Absolute Latest Ref Range: 0 - 0 K/uL  0.0  Abs Immature Granulocytes Latest Ref Range: 0.00 - 0.07 K/uL  0.03  Bilirubin Urine Latest Ref Range: NEGATIVE  NEGATIVE   Glucose, UA Latest Ref Range: NEGATIVE mg/dL NEGATIVE   Hgb urine dipstick Latest Ref Range: NEGATIVE  TRACE (A)   Ketones, ur Latest Ref Range: NEGATIVE mg/dL NEGATIVE   Leukocytes,Ua Latest Ref Range: NEGATIVE  NEGATIVE   Nitrite Latest Ref Range: NEGATIVE  NEGATIVE   pH Latest Ref Range: 5.0 - 8.0  7.0   Protein Latest Ref Range: NEGATIVE mg/dL NEGATIVE   Specific Gravity, Urine Latest Ref Range: 1.005 - 1.030  1.010   Urobilinogen, UA Latest Ref Range: 0.0 - 1.0 mg/dL 0.2     EXAM: CHEST -  2 VIEW   COMPARISON:  None.   FINDINGS: The cardiomediastinal contours are normal. The lungs are clear. Pulmonary vasculature is normal. No consolidation, pleural effusion, or pneumothorax. No acute osseous abnormalities are seen.   IMPRESSION: Negative radiographs of the chest.     Electronically Signed   By: Narda RutherfordMelanie  Sanford M.D.   On: 04/23/2020 17:11      ED Prescriptions    None     PDMP not reviewed this encounter.   Eustace MooreNelson, Ivianna Notch Sue, MD 04/23/20 [phone]

## 2020-04-23 NOTE — Discharge Instructions (Addendum)
Chest x ray and lab testing is reassuring.  The white blood count is low because of the viral infection.  The CBC needs to be repeated in 2 weeks.  Strep test is negative.  By report Covid test and flu tests are negative as well This appears to be a viral infection.  Rest, fluids, Tylenol are the only treatments at this time likely to cause benefit.  Zofran may be taken for nausea. Appetite is poor but it is okay as long as he keeps drinking Need to follow-up if not improving in 72 hours.  Return sooner if worse instead of better at any time  Results for Paul Rosario, Paul Rosario (MRN [id number]) as of 04/23/2020 18:04  Ref. Range 04/23/2020 17:05 04/23/2020 17:17  WBC Latest Ref Range: 4.0 - 10.5 K/uL  2.9 (L)  RBC Latest Ref Range: 4.22 - 5.81 MIL/uL  5.65  Hemoglobin Latest Ref Range: 13.0 - 17.0 g/dL  66.4  HCT Latest Ref Range: 39 - 52 %  42.4  MCV Latest Ref Range: 80.0 - 100.0 fL  75.0 (L)  MCH Latest Ref Range: 26.0 - 34.0 pg  24.4 (L)  MCHC Latest Ref Range: 30.0 - 36.0 g/dL  40.3  RDW Latest Ref Range: 11.5 - 15.5 %  12.7  Platelets Latest Ref Range: 150 - 400 K/uL  125 (L)  nRBC Latest Ref Range: 0.0 - 0.2 %  0.0  Neutrophils Latest Units: %  76  Lymphocytes Latest Units: %  18  Monocytes Relative Latest Units: %  5  Eosinophil Latest Units: %  0  Basophil Latest Units: %  0  Immature Granulocytes Latest Units: %  1  NEUT# Latest Ref Range: 1.7 - 7.7 K/uL  2.2  Lymphocyte # Latest Ref Range: 0.7 - 4.0 K/uL  0.5 (L)  Monocyte # Latest Ref Range: 0 - 1 K/uL  0.2  Eosinophils Absolute Latest Ref Range: 0 - 0 K/uL  0.0  Basophils Absolute Latest Ref Range: 0 - 0 K/uL  0.0  Abs Immature Granulocytes Latest Ref Range: 0.00 - 0.07 K/uL  0.03  Bilirubin Urine Latest Ref Range: NEGATIVE  NEGATIVE   Glucose, UA Latest Ref Range: NEGATIVE mg/dL NEGATIVE   Hgb urine dipstick Latest Ref Range: NEGATIVE  TRACE (A)   Ketones, ur Latest Ref Range: NEGATIVE mg/dL NEGATIVE   Leukocytes,Ua Latest Ref  Range: NEGATIVE  NEGATIVE   Nitrite Latest Ref Range: NEGATIVE  NEGATIVE   pH Latest Ref Range: 5.0 - 8.0  7.0   Protein Latest Ref Range: NEGATIVE mg/dL NEGATIVE   Specific Gravity, Urine Latest Ref Range: 1.005 - 1.030  1.010   Urobilinogen, UA Latest Ref Range: 0.0 - 1.0 mg/dL 0.2     EXAM: CHEST - 2 VIEW   COMPARISON:  None.   FINDINGS: The cardiomediastinal contours are normal. The lungs are clear. Pulmonary vasculature is normal. No consolidation, pleural effusion, or pneumothorax. No acute osseous abnormalities are seen.   IMPRESSION: Negative radiographs of the chest.     Electronically Signed   By: Narda Rutherford M.D.   On: 04/23/2020 17:11

## 2020-04-23 NOTE — Progress Notes (Signed)
Virtual Visit via Video Note I connected with Paul Rosario on 04/23/20 by a video enabled telemedicine application and verified that I am speaking with the correct person using two identifiers.  Location patient: Car, waiting outside the ER. Location provider:work  office Persons participating in the virtual visit: patient, mother,provider  I discussed the limitations of evaluation and management by telemedicine and the availability of in person appointments. The patient expressed understanding and agreed to proceed.  Chief Complaint  Patient presents with  . Fever  . Rash    HPI: Paul Rosario is an otherwise healthy 19 yo male c/o 4 days of fever,chills,body aches,and skin rash. Symptoms started 4 days ago. Forehead temp 100.5-101.7 F. Bitemporal and occipital pressure like headache. No associated visual changes,neck stiffness,sore throat,nasal congestion,focal deficit,or MS changes. Decreased appetite and fatigue. Denies anosmia or ageusia, states that he has "bitter" taste in his mouth.  Today left-sided chest achy pain.It is not radiated. Negative for cough,wheezing,SOB,palpitations,or vomiting. Some nausea.  Periumbilical abdominal pain, mild. He has not noted diarrhea. He noted a rash earlier this morning, it is not pruritic but "sensitive." Rashes affecting upper and lower extremities.  No sick contact, recent travel, camping,or insect/tick bite. He has not noted bruising or swollen glands.  Vaccines are up-to-date, including meningitis. COVID 19 vaccination was completed in 09/2019.  He is taking Advil, last dose today. He has also taken Mucinex, even though he has not had respiratory symptoms.  Waiting in the ER at [facility]. COVID 19 rapid test and rapid flu negative. PCR COVID 19 test pending.  ROS: See pertinent positives and negatives per HPI.  History reviewed. No pertinent past medical history.  History reviewed. No pertinent surgical history.  Family History   Problem Relation Age of Onset  . Healthy Mother   . Healthy Father     Social History   Socioeconomic History  . Marital status: Single    Spouse name: Not on file  . Number of children: Not on file  . Years of education: Not on file  . Highest education level: Not on file  Occupational History  . Not on file  Tobacco Use  . Smoking status: Never Smoker  . Smokeless tobacco: Never Used  Vaping Use  . Vaping Use: Former  . Quit date: 11/22/2019  Substance and Sexual Activity  . Alcohol use: Never  . Drug use: Not Currently    Types: Marijuana    Comment: a while ago  . Sexual activity: Not on file  Other Topics Concern  . Not on file  Social History Narrative  . Not on file   Social Determinants of Health   Financial Resource Strain:   . Difficulty of Paying Living Expenses: Not on file  Food Insecurity:   . Worried About Programme researcher, broadcasting/film/video in the Last Year: Not on file  . Ran Out of Food in the Last Year: Not on file  Transportation Needs:   . Lack of Transportation (Medical): Not on file  . Lack of Transportation (Non-Medical): Not on file  Physical Activity:   . Days of Exercise per Week: Not on file  . Minutes of Exercise per Session: Not on file  Stress:   . Feeling of Stress : Not on file  Social Connections:   . Frequency of Communication with Friends and Family: Not on file  . Frequency of Social Gatherings with Friends and Family: Not on file  . Attends Religious Services: Not on file  . Active Member of  Clubs or Organizations: Not on file  . Attends Banker Meetings: Not on file  . Marital Status: Not on file  Intimate Partner Violence:   . Fear of Current or Ex-Partner: Not on file  . Emotionally Abused: Not on file  . Physically Abused: Not on file  . Sexually Abused: Not on file    Current Outpatient Medications:  .  ondansetron (ZOFRAN) 4 MG tablet, Take 1 tablet (4 mg total) by mouth every 8 (eight) hours as needed for nausea  or vomiting., Disp: 15 tablet, Rfl: 0  EXAM:  VITALS per patient if applicable:N/A  GENERAL: alert, oriented, appears well and in no acute distress HEENT: atraumatic, conjunttiva clear, no obvious abnormalities on inspection of external nose and ears NECK: normal movements of the head and neck Soreness with neck ROM, not limited. LUNGS: on inspection no signs of respiratory distress, breathing rate appears normal, no obvious gross SOB, gasping or wheezing CV: no obvious cyanosis MS: moves all visible extremities without noticeable abnormality SKIN: I cannot appreciate rash in detail. I do not see edema. PSYCH/NEURO: pleasant and cooperative, no obvious depression or anxiety, speech and thought processing grossly intact  ASSESSMENT AND PLAN:  Discussed the following assessment and plan:  Viral illness Hx suggest a viral illness. Recommend symptomatic treatment with Tylenol 500 mg q 4-6 hours prn, plenty of po fluids (small sips of water:Gatorade,mix 1:1, and rest.  Avoid NSAID's. Continue checking temp but oral.  COVID 19 PCR test pending. We discussed the option on monoclonal antibodies for certain population if COVID 19 test is positive.He has not major risk factors,so I do not think he would needed. Clearly instructed about warning signs.  Nausea without vomiting - Plan: ondansetron (ZOFRAN) 4 MG tablet Small sips at the time of clear fluids. Monitor for vomiting or diarrhea. Zofran recommended to treat nausea.  Skin rash Explained it is not uncommon to have rash with viral illnesses. His mother reports vaccination up to date and he has not noted tick/insect bites. Monitor for new lesions,worsening headache,or signs of bleeding. If COIVD 19 test is negative,we will consider further testing.  No problem-specific Assessment & Plan notes found for this encounter.  We will wait for COVID-19 result, supposed to be back tomorrow. If negative we will arrange imaging and blood  work. Clearly instructed about warning signs.   I discussed the assessment and treatment plan with the patient. [name] and his mother were  provided an opportunity to ask questions and all were answered. They agreed with the plan and demonstrated an understanding of the instructions.   The patient was advised to call back or seek an in-person evaluation if the symptoms worsen or if the condition fails to improve as anticipated.  Return if symptoms worsen or fail to improve.   Breanna Mcdaniel [place], MD

## 2020-04-23 NOTE — ED Triage Notes (Addendum)
Patient in with completes of headache, chills and fever x 4 days. Patient states he feels hot tired. Red blotchy rash to bilateral arms and from groin to knees that started on today. Patient denies itching. Patient did take mucinex at home. Patient states he was tested for the flu and COVID on yesterday and was negative. Patient was tested at [facility].  Patient has taken Advil at home today around 1 pm.

## 2020-04-26 LAB — CULTURE, GROUP A STREP (THRC)

## 2020-04-27 ENCOUNTER — Telehealth: Payer: Self-pay

## 2020-04-27 NOTE — Telephone Encounter (Signed)
Pts mother is calling in stating that the pt needs a doctors note for school since he was not able to go this whole week.  Pts COVID results came back and they were negative.  Pts mother is not sure when he will be able to set-up a newpt appointment and is aware that Dr. Swaziland is not taking any newpt at this time.  She is aware that someone that Dr. Swaziland is not in the office to get the note today and will be back in the office on Monday 04/30/2020.

## 2020-04-30 NOTE — Telephone Encounter (Signed)
I left pt a voicemail letting him know that the note is up front & ready for pick up.

## 2020-05-02 ENCOUNTER — Encounter: Payer: Self-pay | Admitting: Psychologist

## 2020-05-02 ENCOUNTER — Other Ambulatory Visit: Payer: Self-pay

## 2020-05-02 ENCOUNTER — Telehealth (INDEPENDENT_AMBULATORY_CARE_PROVIDER_SITE_OTHER): Payer: 59 | Admitting: Psychologist

## 2020-05-02 DIAGNOSIS — F4320 Adjustment disorder, unspecified: Secondary | ICD-10-CM

## 2020-05-02 NOTE — Progress Notes (Signed)
  [place] [facility] [place] [facility] [address] [place] [address] Dept: [phone] Dept Fax: [phone] Loc: [phone] Loc Fax: [phone]  Psychology Therapy Session Progress Note  Patient ID: Dewitt Rota, male  DOB: 2000-10-08, 19 y.o.  MRN: [id number]  05/02/2020 Start time: 11 AM End time: 11:30 AM  Session #: Video conference  Virtual Visit via Video Note  I connected with Deddrick B Peavy's mother on 05/02/20 at 11:00 AM EDT by a video enabled telemedicine application and verified that I am speaking with the correct person using two identifiers.  Location: Patient: Home Provider: [facility] office   I discussed the limitations of evaluation and management by telemedicine and the availability of in person appointments. The patient expressed understanding and agreed to proceed.   I discussed the assessment and treatment plan with the patient. The patient was provided an opportunity to ask questions and all were answered. The patient agreed with the plan and demonstrated an understanding of the instructions.   The patient was advised to call back or seek an in-person evaluation if the symptoms worsen or if the condition fails to improve as anticipated.  I provided 30 minutes of non-face-to-face time during this encounter.    Present: mother  Service provided: [id number] Individual Psychotherapy (30 min.)  Current Concerns: Mild anxiety and dysphoria.  Did not get readmitted/reaccepted to [employer].  Currently is taking 3 classes at [employer], has applied and been accepted to [employer] for the spring semester, is reapplying to [employer] for the spring semester.  Chronically late to class.  Parents believe he is overly enmeshed with girlfriend.  Not working or going to the gym.  Current Symptoms:  Academic problems, Anxiety, Depressed Mood and Family Stress  Mental Status: Per mother Appearance: Well Groomed Attention: good  Motor Behavior: Normal Affect: Full Range Mood: anxious and irritable Thought Process: normal Thought Content: normal Suicidal Ideation: None Homicidal Ideation:None Orientation: time, place and person Insight: Fair Judgement: Fair  Diagnosis: Adjustment disorder  Long Term Treatment Goals:  1) decrease anxiety 2) resist flight/freeze response 3) identify anxiety inducing thoughts 4) use relaxation strategies (deep breathing, visualization, cognitive cueing, muscle relaxation)   Long-term goals for depression:  1) improved mood 2) increase energy level 3) increase socialization 4) decrease anhedonia 5) utilized cognitive behavioral therapy principles   Anticipated Frequency of Visits: Every 2 weeks Anticipated Length of Treatment Episode: 3 months  Treatment Intervention: Cognitive Behavioral therapy  Response to Treatment: Neutral  Medical Necessity: Assisted patient to achieve or maintain maximum functional capacity  Plan: CBT  RJolene Provost 05/02/2020

## 2020-05-08 ENCOUNTER — Ambulatory Visit (INDEPENDENT_AMBULATORY_CARE_PROVIDER_SITE_OTHER): Payer: 59 | Admitting: Psychologist

## 2020-05-08 ENCOUNTER — Encounter: Payer: Self-pay | Admitting: Psychologist

## 2020-05-08 ENCOUNTER — Other Ambulatory Visit: Payer: Self-pay

## 2020-05-08 DIAGNOSIS — F4322 Adjustment disorder with anxiety: Secondary | ICD-10-CM

## 2020-05-08 NOTE — Progress Notes (Signed)
  [place] [facility] [place] [facility] [address] [place] [address] Dept: [phone] Dept Fax: [phone] Loc: [phone] Loc Fax: [phone]  Psychology Therapy Session Progress Note  Patient ID: Dewitt Rota, male  DOB: 03-19-2001, 19 y.o.  MRN: [id number]  05/08/2020 Start time: 2:15 PM End time: 3 PM  Session #: In office psychotherapy session  Present: patient  Service provided: 46568L Individual Psychotherapy (45 min.)  Current Concerns: Mild anxiety.  Having difficulty deciding whether to attend [employer] or [employer] for the spring semester.  Currently taking classes at [employer].  Current Symptoms: Anxiety  Mental Status: Appearance: Well Groomed Attention: good  Motor Behavior: Normal Affect: Full Range Mood: anxious Thought Process: normal Thought Content: normal Suicidal Ideation: None Homicidal Ideation:None Orientation: time, place and person Insight: Fair Judgement: Fair  Diagnosis: Adjustment disorder with anxiety  Long Term Treatment Goals:  1) decrease anxiety 2) resist flight/freeze response 3) identify anxiety inducing thoughts 4) use relaxation strategies (deep breathing, visualization, cognitive cueing, muscle relaxation)     Anticipated Frequency of Visits: As needed Anticipated Length of Treatment Episode: As needed  Treatment Intervention: Cognitive Behavioral therapy  Response to Treatment: Neutral  Medical Necessity: Assisted patient to achieve or maintain maximum functional capacity  Plan: CBT  RJolene Provost 05/08/2020

## 2020-05-24 ENCOUNTER — Other Ambulatory Visit: Payer: Self-pay

## 2020-05-24 ENCOUNTER — Ambulatory Visit (INDEPENDENT_AMBULATORY_CARE_PROVIDER_SITE_OTHER): Payer: 59 | Admitting: Psychologist

## 2020-05-24 ENCOUNTER — Encounter: Payer: Self-pay | Admitting: Psychologist

## 2020-05-24 DIAGNOSIS — F4322 Adjustment disorder with anxiety: Secondary | ICD-10-CM

## 2020-05-24 NOTE — Progress Notes (Signed)
  [place] [facility] [place] [facility] [address] [place] [address] Dept: [phone] Dept Fax: [phone] Loc: [phone] Loc Fax: [phone]  Psychology Therapy Session Progress Note  Patient ID: Paul Rosario, male  DOB: Oct 08, 2000, 19 y.o.  MRN: [id number]  05/24/2020 Start time: 2:15 PM End time: 3:59 PM  Session #: In office psychotherapy session  Present: patient  Service provided: 88416S Individual Psychotherapy (45 min.)  Current Concerns: Mild anxiety and and dysphoria secondary to life circumstances. Struggled with online classes at [employer] during pandemic. Grades in mental state were such he did not return to school for spring semester. On positive side he has been taking classes at the local community college and and appears to be making A's and B's. He has been accepted back to [employer], as well as [employer], and will also apply to [employer] to attend in the spring.  Current Symptoms: Academic problems, Anxiety, Depressed Mood and Organization problem  Mental Status: Appearance: Well Groomed Attention: good  Motor Behavior: Normal Affect: Full Range Mood: anxious and sad Thought Process: normal Thought Content: normal Suicidal Ideation: None Homicidal Ideation:None Orientation: time, place and person Insight: Fair Judgement: Fair  Diagnosis: Adjustment disorder with mild anxiety, but to a lesser extent mild to dysphoria.  Long Term Treatment Goals:  1) decrease anxiety 2) resist flight/freeze response 3) identify anxiety inducing thoughts 4) use relaxation strategies (deep breathing, visualization, cognitive cueing, muscle relaxation)   Long-term goals for mild dysphoria:  1) improved mood 2) increase energy level 3) increase socialization 4) decrease anhedonia 5) utilized cognitive behavioral therapy  principles   Anticipated Frequency of Visits: Weekly to every other week Anticipated Length of Treatment Episode: 3 months  Treatment Intervention: Cognitive Behavioral therapy  Response to Treatment: Positive as evidenced by patient report of improved mood, future orientation towards attending University in the spring, and report of 2 A's and a B in his 3 community college classes.  Medical Necessity: Assisted patient to achieve or maintain maximum functional capacity  Plan: CBT, will write a letter in support of a medical/psychological withdrawal for fall semester at [employer]  Tristine Langi. Jolene Provost 05/24/2020

## 2020-05-31 ENCOUNTER — Other Ambulatory Visit: Payer: Self-pay

## 2020-05-31 ENCOUNTER — Encounter: Payer: Self-pay | Admitting: Psychologist

## 2020-05-31 ENCOUNTER — Ambulatory Visit (INDEPENDENT_AMBULATORY_CARE_PROVIDER_SITE_OTHER): Payer: 59 | Admitting: Psychologist

## 2020-05-31 DIAGNOSIS — F4322 Adjustment disorder with anxiety: Secondary | ICD-10-CM | POA: Diagnosis not present

## 2020-05-31 NOTE — Progress Notes (Signed)
  [place] [facility] [place] [facility] [address] [place] [address] Dept: [phone] Dept Fax: [phone] Loc: [phone] Loc Fax: [phone]  Psychology Therapy Session Progress Note  Patient ID: Paul Rosario, male  DOB: 03-25-2001, 19 y.o.  MRN: [id number]  05/31/2020 Start time: 2:15 PM End time: 3 PM  Session #: In office psychotherapy session  Present: patient  Service provided: 93570V Individual Psychotherapy (45 min.)  Current Concerns: Mild anxiety and dysphoria resolving.  Struggling to make decision regarding college choice for the spring.  Final choice and come down to [employer] [employer] or [employer].  Has had a viral infection the last 2 weeks causing him to miss school which is caused his anxiety to percolate a little bit more as he is trying to catch up.  Current Symptoms: Anxiety and Depressed Mood  Mental Status: Appearance: Well Groomed Attention: good  Motor Behavior: Normal Affect: Full Range Mood: anxious and dysthymic Thought Process: normal Thought Content: normal Suicidal Ideation: None Homicidal Ideation:None Orientation: time, place and person Insight: Fair Judgement: Fair  Diagnosis: Adjustment disorder with anxiety and mild depressed mood  Long Term Treatment Goals:  1) decrease anxiety 2) resist flight/freeze response 3) identify anxiety inducing thoughts 4) use relaxation strategies (deep breathing, visualization, cognitive cueing, muscle relaxation)   Long-term goals for depression:  1) improved mood 2) increase energy level 3) increase socialization 4) decrease anhedonia 5) utilized cognitive behavioral therapy principles   Anticipated Frequency of Visits: Weekly to every other week  Anticipated Length of Treatment Episode: 2 months  Treatment Intervention: Cognitive  Behavioral therapy  Response to Treatment: Positive as evidenced by patient report of reduced anxiety and depressed mood  Medical Necessity: Assisted patient to achieve or maintain maximum functional capacity  Plan: CBT  RJolene Provost 05/31/2020

## 2020-06-02 ENCOUNTER — Ambulatory Visit: Payer: Self-pay | Attending: Internal Medicine

## 2020-06-02 ENCOUNTER — Ambulatory Visit: Payer: Self-pay

## 2020-06-02 DIAGNOSIS — Z23 Encounter for immunization: Secondary | ICD-10-CM

## 2020-06-02 NOTE — Progress Notes (Signed)
° °  Covid-19 Vaccination Clinic  Name:  Paul Rosario    MRN: [id number] DOB: 2000/08/26  06/02/2020  Paul Rosario was observed post Covid-19 immunization for 15 minutes without incident. He was provided with Vaccine Information Sheet and instruction to access the V-Safe system.   Paul Rosario was instructed to call 911 with any severe reactions post vaccine:  Difficulty breathing   Swelling of face and throat   A fast heartbeat   A bad rash all over body   Dizziness and weakness   Immunizations Administered    Name Date Dose VIS Date Route   Pfizer COVID-19 Vaccine 06/02/2020  3:03 PM 0.3 mL 05/09/2020 Intramuscular   Manufacturer: [employer], [employer]   Lot: [id number]   NDC: [address]-1

## 2020-06-30 DIAGNOSIS — Z1159 Encounter for screening for other viral diseases: Secondary | ICD-10-CM | POA: Diagnosis not present

## 2020-07-26 ENCOUNTER — Other Ambulatory Visit: Payer: Self-pay

## 2020-07-26 ENCOUNTER — Ambulatory Visit (INDEPENDENT_AMBULATORY_CARE_PROVIDER_SITE_OTHER): Payer: 59 | Admitting: Psychologist

## 2020-07-26 ENCOUNTER — Encounter: Payer: Self-pay | Admitting: Psychologist

## 2020-07-26 DIAGNOSIS — F4322 Adjustment disorder with anxiety: Secondary | ICD-10-CM | POA: Diagnosis not present

## 2020-07-26 NOTE — Progress Notes (Signed)
  [facility] [facility] [place] [facility] [address] [place] [address] Dept: [phone] Dept Fax: [phone] Loc: [phone] Loc Fax: [phone]  Psychology Therapy Session Progress Note  Patient ID: Paul Rosario, male  DOB: 05/13/2001, 19 y.o.  MRN: [id number]  07/26/2020 Start time: 11:15 AM End time: 12 noon  Session #: In office psychotherapy session  Present: patient  Service provided: 27741O Individual Psychotherapy (45 min.)  Current Concerns: Mild anxiety and dysphoria which are significantly improved.  Still struggling to determine whether he will attend [employer] or [employer].  Both University start classes this coming Monday, so decision has to be made today or tomorrow at the very latest.  Current Symptoms: Anxiety and Depressed Mood which are both significantly improved  Mental Status: Appearance: Well Groomed Attention: good  Motor Behavior: Normal Affect: Restricted Mood: normal Thought Process: normal Thought Content: normal Suicidal Ideation: None Homicidal Ideation:None Orientation: time, place and person Insight: Fair Judgement: Fair   Diagnosis: Adjustment disorder with mild anxiety and depressed mood  Long Term Treatment Goals:  1) decrease anxiety 2) resist flight/freeze response 3) identify anxiety inducing thoughts 4) use relaxation strategies (deep breathing, visualization, cognitive cueing, muscle relaxation)   Long-term goals for depression:  1) improved mood 2) increase energy level 3) increase socialization 4) decrease anhedonia 5) utilized cognitive behavioral therapy principles   Anticipated Frequency of Visits: As needed Anticipated Length of Treatment Episode: As needed  Treatment Intervention: Cognitive Behavioral therapy  Response to Treatment: Positive as  evidenced by 2A's and 1B in his 3 college classes this semester.  As evidenced by his acceptance to the [employer] and [employer].  As evidenced by self-report of significantly improved mood  Medical Necessity: Assisted patient to achieve or maintain maximum functional capacity  Plan: CBT  RJolene Provost 07/26/2020

## 2020-09-29 DIAGNOSIS — H52223 Regular astigmatism, bilateral: Secondary | ICD-10-CM | POA: Diagnosis not present

## 2021-02-11 IMAGING — DX DG CHEST 2V
2 series · 2 of 2 positions shown · non-contrast
Comparison: None.

CLINICAL DATA: Fever, chills, nausea and rash.

EXAM:
CHEST - 2 VIEW

[chest pa]
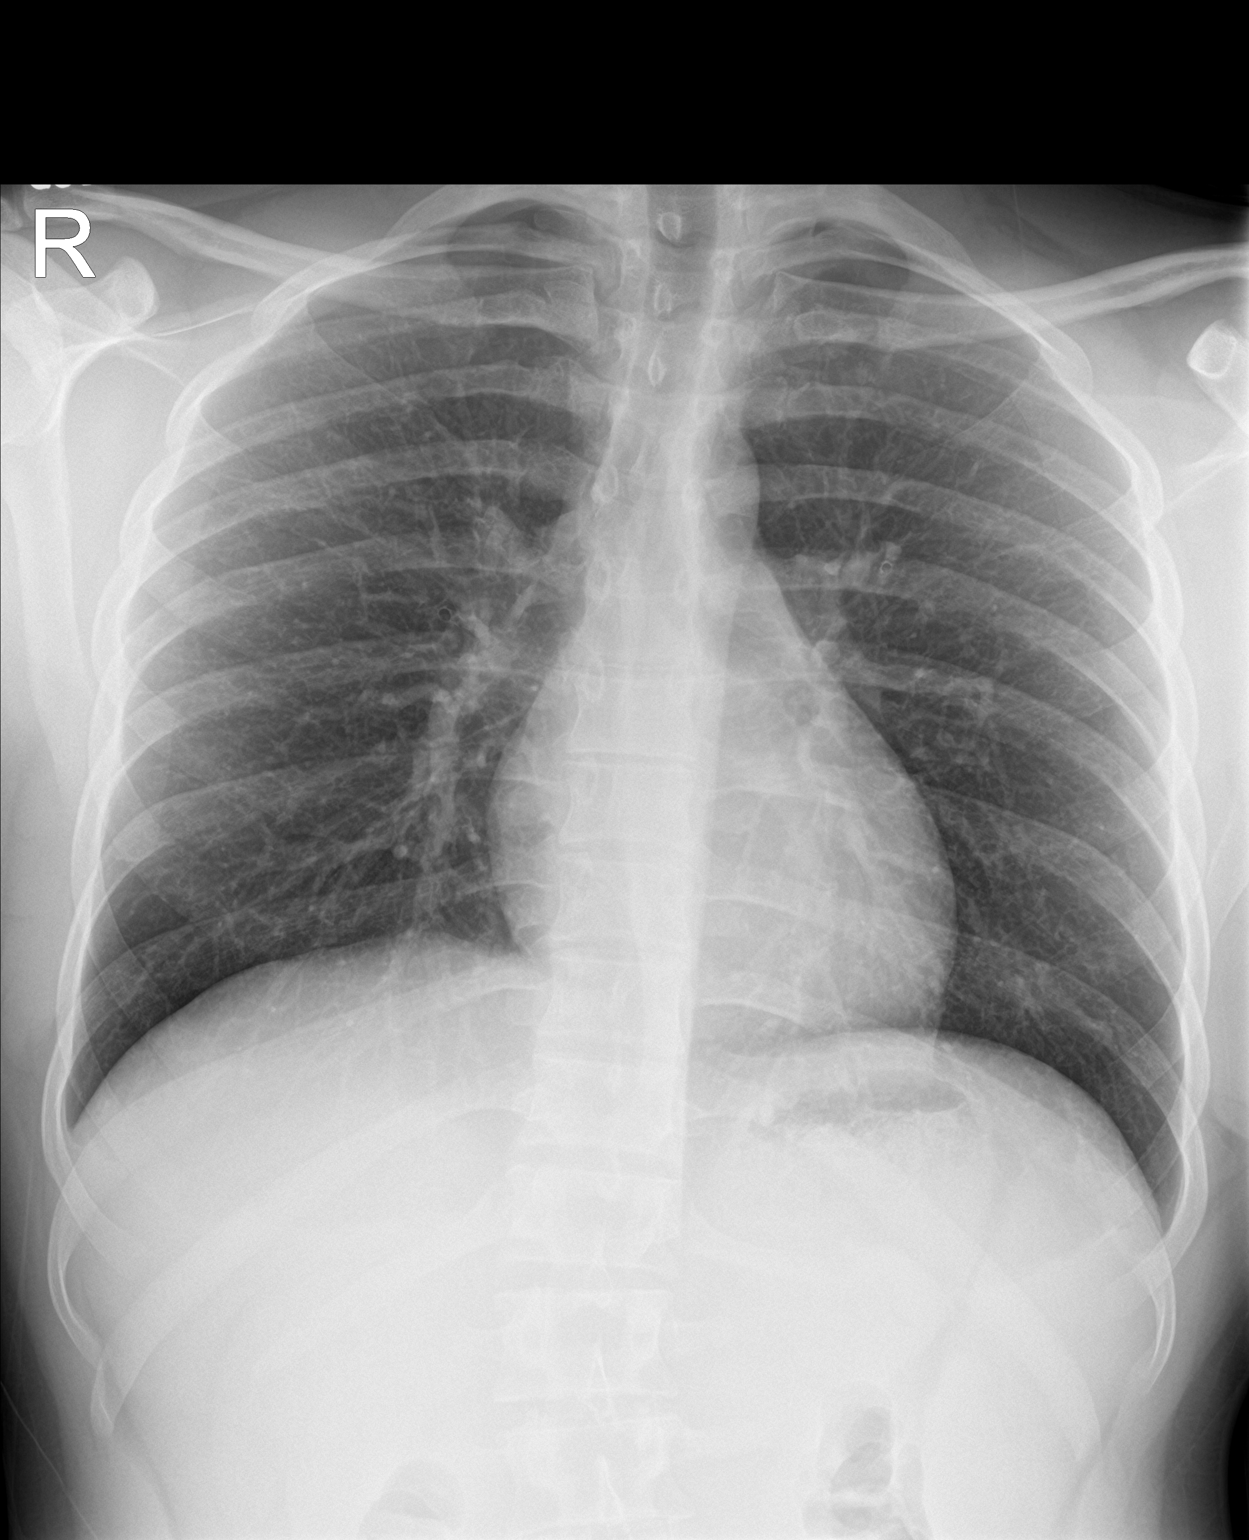

[chest lat]
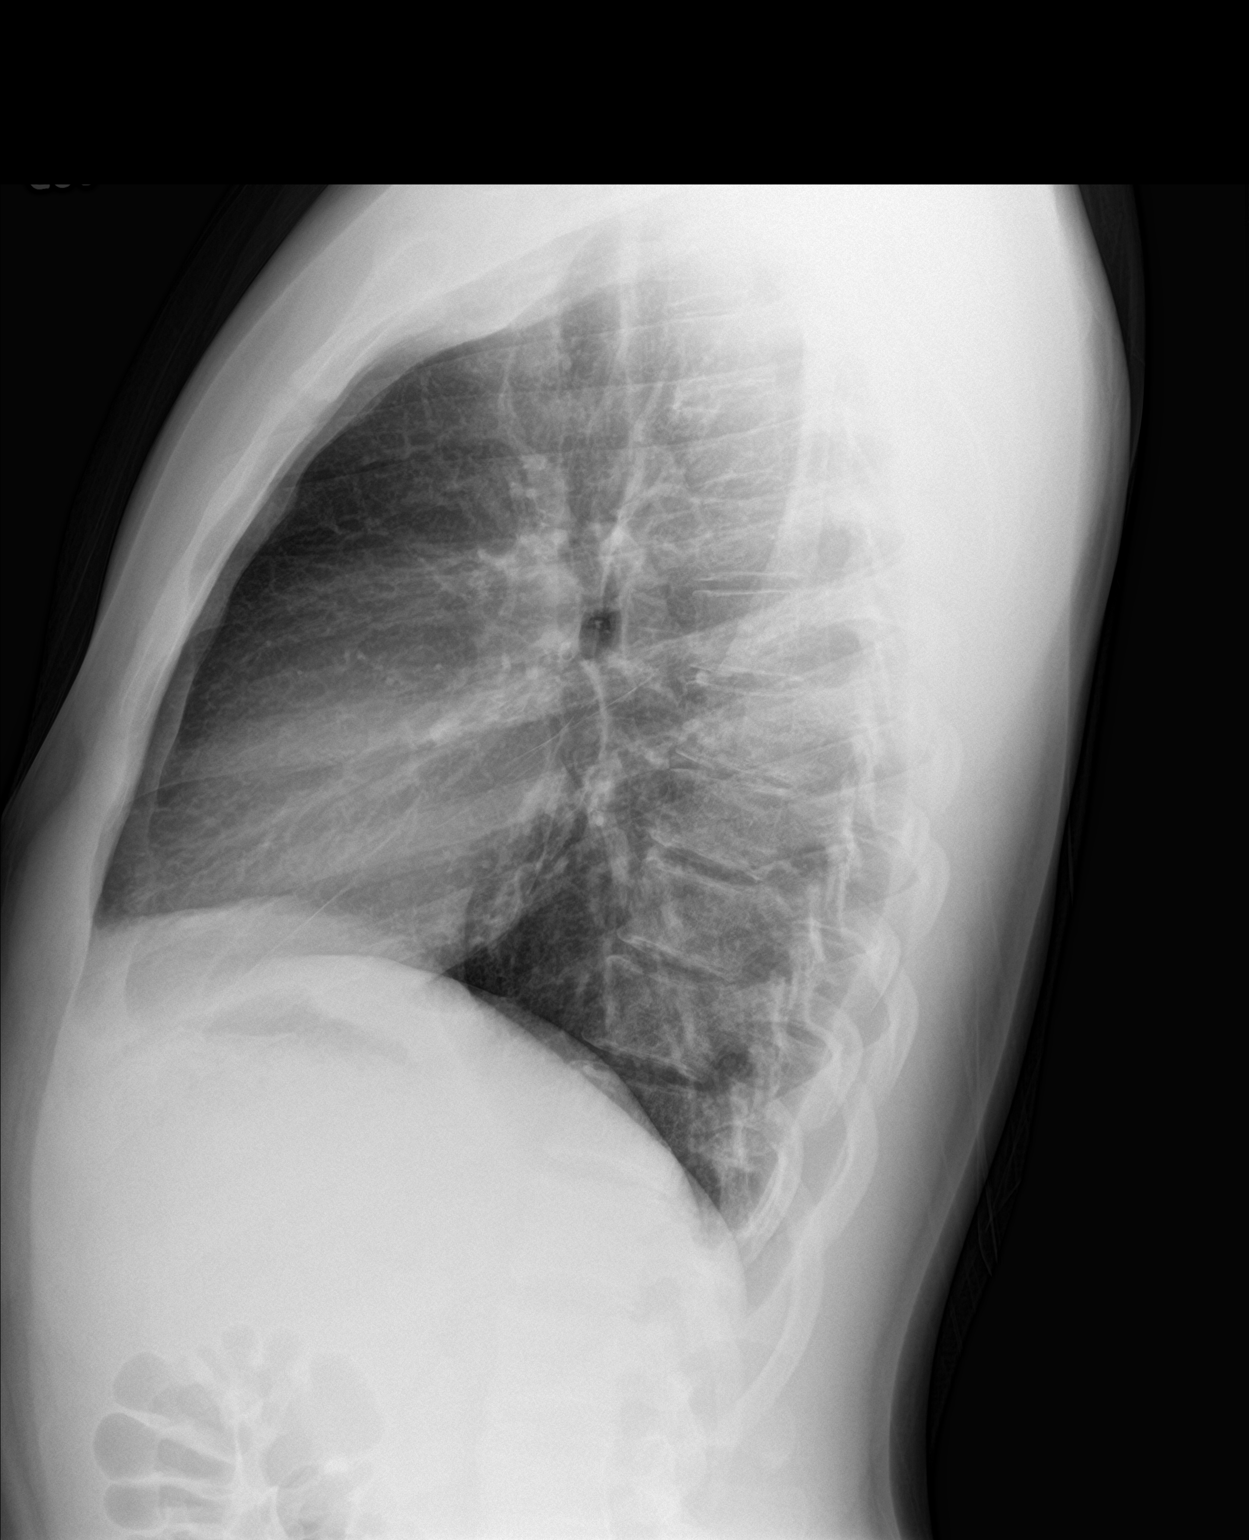

[2 of 2 positions shown; findings below may reference images not displayed]

FINDINGS: The cardiomediastinal contours are normal. The lungs are clear.
Pulmonary vasculature is normal. No consolidation, pleural effusion,
or pneumothorax. No acute osseous abnormalities are seen.
IMPRESSION: Negative radiographs of the chest.

## 2021-03-06 ENCOUNTER — Ambulatory Visit (INDEPENDENT_AMBULATORY_CARE_PROVIDER_SITE_OTHER): Payer: 59 | Admitting: Psychologist

## 2021-03-06 ENCOUNTER — Encounter: Payer: Self-pay | Admitting: Psychologist

## 2021-03-06 ENCOUNTER — Other Ambulatory Visit: Payer: Self-pay

## 2021-03-06 DIAGNOSIS — F4322 Adjustment disorder with anxiety: Secondary | ICD-10-CM

## 2021-03-06 NOTE — Progress Notes (Signed)
  [place] [facility] [place] [facility] [address] [place] [address] Dept: [phone] Dept Fax: [phone] Loc: [phone] Loc Fax: [phone]  Psychology Therapy Session Progress Note  Patient ID: Paul Rosario, male  DOB: 23-Apr-2001, 19 y.o.  MRN: [id number]  03/06/2021 Start time: 11 AM End time: 11:50 AM  Session #: In office psychotherapy session  Present: patient  Service provided: 62035D Individual Psychotherapy (45 min.)  Current Concerns: Mild anxiety significantly improved.  Decision making significantly improved.  Attended [employer] for spring semester and received a 4.0 grade point average.  Classes start back next week.  Mild anxiety regarding starting back college.  Also conflicted over whether or not to break-up with girlfriend.  Current Symptoms: Anxiety  Mental Status: Appearance: Well Groomed Attention: good  Motor Behavior: Normal Affect: Full Range Mood: anxious Thought Process: normal Thought Content: normal Suicidal Ideation: None Homicidal Ideation:None Orientation: time, place, and person Insight: Intact Judgement: Good  Diagnosis: Adjustment disorder with mild anxiety, improved  Long Term Treatment Goals:  1) decrease anxiety 2) resist flight/freeze response 3) identify anxiety inducing thoughts 4) use relaxation strategies (deep breathing, visualization, cognitive cueing, muscle relaxation)    Anticipated Frequency of Visits: As needed Anticipated Length of Treatment Episode: As needed   Treatment Intervention: Cognitive Behavioral therapy  Response to Treatment: Positive as evidenced by improved decision-making, significantly improved grades and accountability  Medical Necessity: Assisted patient to achieve or maintain maximum functional capacity  Plan: CBT  RJolene Provost 03/06/2021

## 2021-09-20 ENCOUNTER — Ambulatory Visit (INDEPENDENT_AMBULATORY_CARE_PROVIDER_SITE_OTHER): Payer: 59 | Admitting: Psychologist

## 2021-09-20 ENCOUNTER — Other Ambulatory Visit: Payer: Self-pay

## 2021-09-20 ENCOUNTER — Encounter: Payer: Self-pay | Admitting: Psychologist

## 2021-09-20 DIAGNOSIS — F4321 Adjustment disorder with depressed mood: Secondary | ICD-10-CM | POA: Diagnosis not present

## 2021-09-20 NOTE — Progress Notes (Signed)
?  [place] [facility] ?[place] [facility] ?[facility] ?[address], [address] ?[place] [address] ?Dept: [phone] ?Dept Fax: [phone] ?Loc: [phone] ?Loc Fax: [phone] ? ?Psychology Therapy Session Progress Note ? ?Patient ID: Paul Rosario, male  DOB: 30-Mar-2001, 21 y.o.  MRN: [id number] ? ?09/20/2021 ?Start time: 11:15 AM ?End time: 12 noon ? ?Session #: In office psychotherapy session ? ?Present: patient ? ?Service provided: 03559R Individual Psychotherapy (45 min.) ? ?Current Concerns: Adjustment disorder with depressed mood.  Processing grief related to his paternal grandmother's death in 2022/08/25.  Relationship issues with long-term girlfriend which appear to have them on the cusp of a probable break-up soon.  On positive side, he has Patent attorney.  He had a 4.0 last semester and all A's in his classes this semester.  He is considering medical school or dental school in the future.  He is exercising regularly and has made good social contacts at [employer]. ? ?Current Symptoms: Sadness and grief ? ?Mental Status: ?Appearance: Well Groomed ?Attention: good  ?Motor Behavior: Normal ?Affect: Full Range ?Mood: sad ?Thought Process: normal ?Thought Content: normal ?Suicidal Ideation: None ?Homicidal Ideation:None ?Orientation: time, place, and person ?Insight: Intact ?Judgement: Good ? ?Diagnosis: Adjustment disorder with mild depression, sadness and grief secondary to grandmother's death in August 25, 2022 ? ?Long Term Treatment Goals: Long-term goals for depression: ? ?1) improved mood ?2) increase energy level ?3) increase socialization ?4) decrease anhedonia ?5) utilized cognitive behavioral therapy principles ? ? ?Anticipated Frequency of Visits: As necessary ?Anticipated Length of Treatment Episode: As necessary ? ?Treatment Intervention: Cognitive Behavioral therapy ? ?Response to Treatment: Positive as  evidenced by consistent and sustained academic success this year. ? ?Medical Necessity: Assisted patient to achieve or maintain maximum functional capacity ? ?Plan: CBT, discussed my retirement and referral to therapist after I leave ? ?Lamonda N[place]on. Jolene Provost ?09/20/2021 ? ?  ? ? ? ? ? ? ? ?

## 2021-09-24 ENCOUNTER — Telehealth: Payer: 59 | Admitting: Psychologist

## 2021-10-10 ENCOUNTER — Encounter: Payer: Self-pay | Admitting: Psychologist

## 2021-10-10 ENCOUNTER — Other Ambulatory Visit: Payer: Self-pay

## 2021-10-10 ENCOUNTER — Telehealth (INDEPENDENT_AMBULATORY_CARE_PROVIDER_SITE_OTHER): Payer: 59 | Admitting: Psychologist

## 2021-10-10 DIAGNOSIS — F4321 Adjustment disorder with depressed mood: Secondary | ICD-10-CM

## 2021-10-10 NOTE — Progress Notes (Signed)
?  [facility] [facility] ?[place] [facility] ?[facility] ?[address] ?[place] [address] ?Dept: [phone] ?Dept Fax: [phone] ?Loc: [phone] ?Loc Fax: [phone] ? ?Psychology Therapy Session Progress Note ? ?Patient ID: Paul Rosario, male  DOB: 05/22/2001, 21 y.o.  MRN: [id number] ? ?10/10/2021 ?Start time: 8:15 AM ?End time: 9 AM ? ?Session #: Virtual psychotherapy session with mother. ?Virtual Visit via Video Note ? ?I connected with Mrs. Horney on 10/10/21 at 8:15am by a video enabled telemedicine application and verified that I am speaking with the correct person using two identifiers. ? ?Location: ?Patient: Home ?Provider:  [facility] office ?  ?I discussed the limitations of evaluation and management by telemedicine and the availability of in person appointments. The patient expressed understanding and agreed to proceed. ?  ?I discussed the assessment and treatment plan with the patient. The patient was provided an opportunity to ask questions and all were answered. The patient agreed with the plan and demonstrated an understanding of the instructions. ?  ?The patient was advised to call back or seek an in-person evaluation if the symptoms worsen or if the condition fails to improve as anticipated. ? ?I provided 45 minutes of non-face-to-face time during this encounter. ? ? ?  ? ?Present: mother ? ?Service provided: 43154M Individual Psychotherapy (45 min.) ? ?Current Concerns: Mild anxiety and depression.  Grief secondary to paternal grandmother's recent death.  Concerned about patient's relationship with girlfriend.  Also concerned about patient full immersion or lack of immersion into the college experience.  He is receiving good grades, but taking the least amount of credits he can possibly take, not availing himself of any of the extracurricular activities or social experiences of  college. ? ?Current Symptoms: Anxiety, Depressed Mood, and Family Stress ? ?Mental Status: Per mother  ?appearance: Well Groomed ?Attention: good  ?Motor Behavior: Normal ?Affect: Full Range ?Mood: anxious, irritable, and sad ?Thought Process: normal ?Thought Content: normal ?Suicidal Ideation: None ?Homicidal Ideation:None ?Orientation: time, place, and person ?Insight: Fair ?Judgement: Fair ? ?Diagnosis: Adjustment disorder with mild anxiety and depressed mood ? ?Long Term Treatment Goals:  ?1) decrease anxiety ?2) resist flight/freeze response ?3) identify anxiety inducing thoughts ?4) use relaxation strategies (deep breathing, visualization, cognitive cueing, muscle relaxation) ? ?Long-term goals for depression: ? ?1) improved mood ?2) increase energy level ?3) increase socialization ?4) decrease anhedonia ?5) utilized cognitive behavioral therapy principles ? ? ?Anticipated Frequency of Visits: Every other week ?Anticipated Length of Treatment Episode: 6 weeks ? ?Treatment Intervention: Cognitive Behavioral therapy, Psychoeducation, and Supportive therapy ? ?Response to Treatment: Positive as evidenced by mother's report of improved grades and academic productivity/performance as well as moderately improved accountability ? ?Medical Necessity: Assisted patient to achieve or maintain maximum functional capacity ? ?Plan: CBT, discussed transition to new therapist when I leave in May. ? ?Maigen Mozingo. Jolene Provost ?10/10/2021 ? ?  ? ? ? ? ? ? ? ?

## 2021-10-22 ENCOUNTER — Encounter: Payer: Self-pay | Admitting: Psychologist

## 2021-10-22 ENCOUNTER — Telehealth (INDEPENDENT_AMBULATORY_CARE_PROVIDER_SITE_OTHER): Payer: 59 | Admitting: Psychologist

## 2021-10-22 DIAGNOSIS — F4321 Adjustment disorder with depressed mood: Secondary | ICD-10-CM | POA: Diagnosis not present

## 2021-10-22 NOTE — Progress Notes (Signed)
?  [facility] ?[facility] ?[facility] ?[address], [address] ?[place] [address] ?Dept: [phone] ?Dept Fax: [phone] ?Loc: [phone] ?Loc Fax: [phone] ? ?Psychology Therapy Session Progress Note ? ?Patient ID: Paul Rosario, male  DOB: 2001-06-18, 21 y.o.  MRN: [id number] ? ?10/22/2021 ?Start time: 11:05 AM ?End time: 11:45 AM ? ?Session #: Virtual psychotherapy session ? ?Virtual Visit via Video Note ? ?I connected with Paul Rosario on 10/22/21 at 11:00 AM EDT by a video enabled telemedicine application and verified that I am speaking with the correct person using two identifiers. ? ?Location: ?Patient: Parked car at [employer] ?Provider: [facility] office ?  ?I discussed the limitations of evaluation and management by telemedicine and the availability of in person appointments. The patient expressed understanding and agreed to proceed. ?  ?I discussed the assessment and treatment plan with the patient. The patient was provided an opportunity to ask questions and all were answered. The patient agreed with the plan and demonstrated an understanding of the instructions. ?  ?The patient was advised to call back or seek an in-person evaluation if the symptoms worsen or if the condition fails to improve as anticipated. ? ?I provided 40 minutes of non-face-to-face time during this encounter. ? ? ?Weslie Pretlow. Jolene Provost, PhD  ? ?Present: patient ? ?Service provided: 94496P Individual Psychotherapy (45 min.) ? ?Current Concerns: History of mild to moderate anxiety and depression significantly improved.  Experiencing grief secondary to his grandmother's death 2 months ago.  Relationship issues with girlfriend which appear to be resolving.  History of significant academic difficulties and failures but now resolved. ? ?Current Symptoms: Grief, mild anxiety ? ?Mental Status: ?Appearance: Well Groomed ?Attention:  good  ?Motor Behavior: Normal ?Affect: Full Range ?Mood: anxious ?Thought Process: normal ?Thought Content: normal ?Suicidal Ideation: None ?Homicidal Ideation:None ?Orientation: time, place, and person ?Insight: Fair ?Judgement: Fair ? ?Diagnosis: Adjustment disorder with mild anxiety and depression ? ?Long Term Treatment Goals:  ?1) decrease anxiety ?2) resist flight/freeze response ?3) identify anxiety inducing thoughts ?4) use relaxation strategies (deep breathing, visualization, cognitive cueing, muscle relaxation) ? ?Long-term goals for depression: ? ?1) improved mood ?2) increase energy level ?3) increase socialization ?4) decrease anhedonia ?5) utilized cognitive behavioral therapy principles ? ? ?Anticipated Frequency of Visits: As needed ?Anticipated Length of Treatment Episode: As needed ? ?Treatment Intervention: Cognitive Behavioral therapy ? ?Response to Treatment: Positive as evidenced by patient report of significantly improved mood and academic productivity and performance ? ?Medical Necessity: Assisted patient to achieve or maintain maximum functional capacity ? ?Plan: CBT as needed in the future.  This was a planned termination session and patient has been given referrals for future reference. ? ?Emmette Katt. Jolene Provost, PhD ?10/22/2021 ? ?  ? ? ? ? ? ? ? ?

## 2021-10-23 ENCOUNTER — Telehealth: Payer: 59 | Admitting: Psychologist

## 2021-12-04 ENCOUNTER — Encounter: Payer: Self-pay | Admitting: Psychologist

## 2021-12-06 ENCOUNTER — Telehealth: Payer: 59 | Admitting: Psychologist

## 2021-12-12 ENCOUNTER — Ambulatory Visit: Payer: 59 | Admitting: Internal Medicine

## 2021-12-18 ENCOUNTER — Ambulatory Visit: Payer: 59 | Admitting: Dermatology

## 2021-12-18 ENCOUNTER — Ambulatory Visit: Payer: 59 | Admitting: Physician Assistant

## 2021-12-18 DIAGNOSIS — L309 Dermatitis, unspecified: Secondary | ICD-10-CM | POA: Diagnosis not present

## 2021-12-18 MED ORDER — CLOBETASOL PROPIONATE 0.05 % EX OINT: 1.0000 "application " | TOPICAL_OINTMENT | Freq: Two times a day (BID) | CUTANEOUS | 3 refills | Status: DC

## 2021-12-18 MED ORDER — CLOBETASOL PROPIONATE 0.05 % EX OINT: 1.0000 "application " | TOPICAL_OINTMENT | Freq: Two times a day (BID) | CUTANEOUS | 3 refills | Status: AC

## 2021-12-18 NOTE — Patient Instructions (Signed)
Patient will use his clobetasol ointment take moist cloth and wrap over ointment x 20 min. Then dry off and re apply cream before bed.

## 2021-12-19 ENCOUNTER — Ambulatory Visit (INDEPENDENT_AMBULATORY_CARE_PROVIDER_SITE_OTHER): Payer: 59 | Admitting: Psychologist

## 2021-12-19 ENCOUNTER — Encounter: Payer: Self-pay | Admitting: Psychologist

## 2021-12-19 DIAGNOSIS — F4321 Adjustment disorder with depressed mood: Secondary | ICD-10-CM

## 2021-12-19 NOTE — Progress Notes (Addendum)
Patient ID: Paul Rosario, male   DOB: 2001-01-16, 21 y.o.   MRN: [id number] Brief telephone consult with mother per her request 8:30 AM to 8:50 AM.  Virtual Visit via Video Note  I connected with Mother on 12/26/21 at  8:30 AM EDT by telephone and verified that I am speaking with the correct person using two identifiers.  Location: Patient: Home Provider: [facility] office   I discussed the limitations of evaluation and management by telemedicine and the availability of in person appointments. The patient expressed understanding and agreed to proceed.   I discussed the assessment and treatment plan with the patient. The patient was provided an opportunity to ask questions and all were answered. The patient agreed with the plan and demonstrated an understanding of the instructions.   The patient was advised to call back or seek an in-person evaluation if the symptoms worsen or if the condition fails to improve as anticipated.  I provided 20 minutes of non-face-to-face time during this encounter.  Brief history: Per mother, Paul Rosario made an excellent adjustment to [place] this year.  Second semester he earned [employer].  He will take 1 online summer school class.  His plan is to increase credit hours in the fall to 15.  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  Parents remain concerned regarding his relationship with girlfriend.  They see him spending time with her at the expense of family, friends, and resume building activities for possible graduate school.  He continues to waffle as to academic goals, considering medical school, dental school, or healthcare administration.  Mental status: Per mother, Paul Rosario's typical mood since he has been home for the summer has been fairly euthymic.  Affect is broad and appropriate to mood.  Thoughts have been clear, coherent, relevant and rational.  Judgment and insight are described as adequate relative to  age.  She does not see any clinically significant issues with anxiety or depression at this time.  No evidence of suicidal or homicidal ideation.  Plan: Continue psychotherapy as needed.  Diagnoses: Adjustment disorder in partial remission.

## 2021-12-20 ENCOUNTER — Encounter: Payer: Self-pay | Admitting: Psychologist

## 2021-12-20 ENCOUNTER — Telehealth (INDEPENDENT_AMBULATORY_CARE_PROVIDER_SITE_OTHER): Payer: 59 | Admitting: Psychologist

## 2021-12-20 DIAGNOSIS — F4321 Adjustment disorder with depressed mood: Secondary | ICD-10-CM | POA: Diagnosis not present

## 2021-12-20 NOTE — Progress Notes (Addendum)
  [facility] [address] [place] [address] Dept: [phone] Dept Fax: [phone] Loc: [phone] Loc Fax: [phone]  Psychology Therapy Session Progress Note  Patient ID: Paul Rosario, male  DOB: Jul 21, 2001, 21 y.o.  MRN: [id number]  12/20/2021 Start time: 8 AM End time: 8:45 AM  Session #: Video psychotherapy session  Virtual Visit via Video Note  I connected with Paul Rosario on 12/20/21 at  8:00 AM EDT by a video enabled telemedicine application and verified that I am speaking with the correct person using two identifiers.  Location: Patient: Home Provider: [facility] office   I discussed the limitations of evaluation and management by telemedicine and the availability of in person appointments. The patient expressed understanding and agreed to proceed.   I discussed the assessment and treatment plan with the patient. The patient was provided an opportunity to ask questions and all were answered. The patient agreed with the plan and demonstrated an understanding of the instructions.   The patient was advised to call back or seek an in-person evaluation if the symptoms worsen or if the condition fails to improve as anticipated.  I provided 45 minutes of non-face-to-face time during this video psychotherapy session  Present: patient  Service provided: [id number] Individual Psychotherapy (45 min.)  Current Concerns: Mild depression in remission.  Weak and inconsistent executive functioning, particularly in the area of metacognition, significantly improved.  History of academic failure and inconsistency significantly improved, now on Dean's list with a GPA greater than 3.5.  Parent/young adult tension around girlfriend.  Some mild anxiety regarding paternal grandfather's health.  Current Symptoms: Family Stress  Mental  Status: Appearance: Well Groomed Attention: good  Motor Behavior: Normal Affect: Full Range Mood: normal Thought Process: normal Thought Content: normal Suicidal Ideation: None Homicidal Ideation:None Orientation: time, place, and person Insight: Fair Judgement: Good  Diagnosis: Adjustment disorder in remission  Long Term Treatment Goals: Long-term goals for depression:  1) improved mood 2) increase energy level 3) increase socialization 4) decrease anhedonia 5) utilized cognitive behavioral therapy principles  *Goals met  Anticipated Frequency of Visits: As needed Anticipated Length of Treatment Episode: As needed  Treatment Intervention: Cognitive Behavioral therapy  Response to Treatment: Positive as evidenced by improved and stable mood, academic success, and current level of productivity (summer internship, summer dental observership, taking two summer school classes)  Medical Necessity: Assisted patient to achieve or maintain maximum functional capacity  Plan: CBT as needed  Elwyn Lowden. Eloise Harman 12/20/2021

## 2022-01-06 ENCOUNTER — Encounter: Payer: Self-pay | Admitting: Dermatology

## 2022-01-06 NOTE — Progress Notes (Signed)
   New Patient   Subjective  Paul Rosario is a 21 y.o. male who presents for the following: Dermatitis (Patient has a blister rash like on both hands x 1 weeks ago. They do itch. And they were painful. Patient did try clobetasol, Aquaphor, Vaseline and coco butter. Patient also wears gloves at night to help. Patient does have history of dermatitis but this is the worst.).  History of chronic dermatitis on hands, but this episode produce significant blistering which is now improving. Location:  Duration:  Quality:  Associated Signs/Symptoms: Modifying Factors:  Severity:  Timing: Context:    The following portions of the chart were reviewed this encounter and updated as appropriate:  Tobacco  Allergies  Meds  Problems  Med Hx  Surg Hx  Fam Hx      Objective  Well appearing patient in no apparent distress; mood and affect are within normal limits. Left Hand - Anterior, Right Hand - Anterior Although there are half dozen 1 cm spots with the pinkness and peripheral scale indicative of a blistering process, there are no active blisters and biopsy may be a relatively low yield today.  The differential includes exacerbation of his historical hand dermatitis, secondary blistering dactylitis, PCT, immune bullous disorders.       A focused examination was performed including hands, feet, nails, head.. Relevant physical exam findings are noted in the Assessment and Plan.   Assessment & Plan  Dermatitis Left Hand - Anterior; Right Hand - Anterior  Patient will use his clobetasol ointment take moist cloth and wrap over ointment x 20 min.  Recheck as soon as there is a flare (if possible) to consider appropriate biopsies.  clobetasol ointment (TEMOVATE) 0.05 % - Left Hand - Anterior, Right Hand - Anterior Apply 1 application. topically 2 (two) times daily.

## 2023-04-29 ENCOUNTER — Encounter: Payer: Self-pay | Admitting: Sports Medicine

## 2023-04-29 ENCOUNTER — Ambulatory Visit (INDEPENDENT_AMBULATORY_CARE_PROVIDER_SITE_OTHER): Payer: Medicaid Other | Admitting: Sports Medicine

## 2023-04-29 VITALS — BP 118/80 | Ht 72.0 in | Wt 184.0 lb

## 2023-04-29 DIAGNOSIS — M25562 Pain in left knee: Secondary | ICD-10-CM

## 2023-04-29 NOTE — Progress Notes (Addendum)
   Subjective:    Patient ID: Paul Rosario, male    DOB: 11-Nov-2000, 22 y.o.   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  This pain began the day after a heavy workout session in the gym followed by basketball.  He did not recall any injury at that time but the following day he woke up with his left knee locked in extension.  He also had some swelling.  Since then the swelling has resolved and he has regained his range of motion but he still experiences instability in the knee as well as some medial knee pain.  Symptoms are most noticeable with jumping as well as with changing direction.  He has been taking some ibuprofen which has been minimally helpful.  He was seen by an orthopedic group near his campus where x-rays were done and they were unremarkable per his report.  He denies any significant problems with this knee in the past.  No prior knee surgeries.  Past medical history reviewed Medications reviewed Allergies reviewed   Review of Systems As above    Objective:   Physical Exam  Well-developed, well-nourished.  No acute distress  Left knee: Full range of motion.  No effusion.  There is no tenderness to palpation along medial or lateral joint lines.  Negative McMurray's.  Negative Thessaly.  Knee is stable to valgus and varus stressing.  Negative anterior drawer, negative posterior drawer.  Neurovascularly intact distally.      Assessment & Plan:   Left knee pain and swelling possibly secondary to OCD  We discussed an MRI specifically to rule out an OCD as a source of his knee locking.  He will talk this over with his parents first.  In the meantime, I will provide him with home exercises to address quadricep strengthening and I recommended that he purchase a compression sleeve and wear when active.  I think he can increase  activity as tolerated but I have recommended against any sort of high impact activity such as competitive tennis, competitive basketball, or heavy close chain lower body exercises.  If he does decide to proceed with MRI, I will MyChart message him with those results when available.  This note was dictated using Dragon naturally speaking software and may contain errors in syntax, spelling, or content which have not been identified prior to signing this note.

## 2023-05-22 ENCOUNTER — Telehealth (HOSPITAL_BASED_OUTPATIENT_CLINIC_OR_DEPARTMENT_OTHER): Payer: Self-pay

## 2023-07-29 ENCOUNTER — Ambulatory Visit (HOSPITAL_BASED_OUTPATIENT_CLINIC_OR_DEPARTMENT_OTHER)
Admission: RE | Admit: 2023-07-29 | Discharge: 2023-07-29 | Disposition: A | Discharge status: Home / Self Care | Payer: Medicaid Other | Source: Ambulatory Visit | Attending: Sports Medicine | Admitting: Sports Medicine

## 2023-07-29 DIAGNOSIS — M25562 Pain in left knee: Secondary | ICD-10-CM | POA: Diagnosis present

## 2023-08-27 ENCOUNTER — Encounter: Payer: Self-pay | Admitting: Sports Medicine

## 2023-09-30 ENCOUNTER — Encounter: Payer: Self-pay | Admitting: Sports Medicine

## 2023-09-30 ENCOUNTER — Telehealth: Payer: Self-pay | Admitting: Sports Medicine

## 2023-09-30 NOTE — Telephone Encounter (Signed)
 I spoke with Paul Rosario on the phone today to discuss MRI findings of his left knee done in January.  MRI shows a small vertical tear of the inner third of the body of the lateral meniscus.  A friend of his is a physical therapist who showed him some home exercises and although he is 80% improved he is not back to doing everything that he would like to do.  I have recommended a trial of formal physical therapy.  He is away at school and will contact me via MyChart with the name and fax number of the location that he would like the referral.  He will follow-up with me in the office when he returns to [employer] at the end of the semester.

## 2023-10-02 ENCOUNTER — Other Ambulatory Visit: Payer: Self-pay | Admitting: *Deleted

## 2023-10-02 DIAGNOSIS — S83207A Unspecified tear of unspecified meniscus, current injury, left knee, initial encounter: Secondary | ICD-10-CM

## 2024-05-20 ENCOUNTER — Ambulatory Visit (INDEPENDENT_AMBULATORY_CARE_PROVIDER_SITE_OTHER): Payer: PRIVATE HEALTH INSURANCE

## 2024-05-20 ENCOUNTER — Other Ambulatory Visit: Payer: Self-pay

## 2024-05-20 VITALS — BP 120/80 | Ht 72.0 in

## 2024-05-20 DIAGNOSIS — G8929 Other chronic pain: Secondary | ICD-10-CM

## 2024-05-20 DIAGNOSIS — M7632 Iliotibial band syndrome, left leg: Secondary | ICD-10-CM

## 2024-05-20 DIAGNOSIS — M2392 Unspecified internal derangement of left knee: Secondary | ICD-10-CM | POA: Diagnosis not present

## 2024-05-20 DIAGNOSIS — M25469 Effusion, unspecified knee: Secondary | ICD-10-CM

## 2024-05-20 DIAGNOSIS — M65962 Unspecified synovitis and tenosynovitis, left lower leg: Secondary | ICD-10-CM

## 2024-05-20 DIAGNOSIS — M25562 Pain in left knee: Secondary | ICD-10-CM

## 2024-05-20 NOTE — Progress Notes (Signed)
 Subjective:    Patient ID: Paul Rosario, male    DOB: 23 y.o., 2000-07-28   MRN: [id number]  Chief Complaint: Left knee pain  History of Present Illness Paul Rosario is a 23 year old male who presents with recurrent knee pain and swelling.  Lateral knee pain and swelling - Recurrent pain and swelling localized to the lateral aspect of the right knee - Symptoms similar to those experienced one year ago - Pain and swelling worsen after playing full court basketball games - Swelling is significant following activity - No current pain in the meniscus area, but persistent pain and swelling on the lateral knee - Sensation of hyperextension or locking during full extension - Full extension is painful; knee flexion is not problematic - Symptoms interfere with ability to play basketball and perform daily activities - No other joint pain or systemic symptoms  Meniscal injury history - MRI one year ago demonstrated a small tear in the inner third of the lateral meniscus - Initial improvement with physical therapy - Resumed basketball following rehabilitation  Rehabilitation and supportive measures - Performs leg extensions, hamstring curls, foam rolling, and stretching exercises as part of rehabilitation regimen - Uses a knee sleeve for compression and support during activities      Objective:   Vitals:   05/20/24 1059  BP: 120/80   Left Knee (compared to normal) -Inspection: Minimal swelling.  No erythema, deformity or visible effusion. -Palpation: TTP - quad tendon, - patella, - patellar tendon, - tibial tuberosity, - pes bursa, + gerdy tubercle, - medial joint line, - lateral joint line, - posterior knee, - medial and lateral hamstrings. No significant crepitus with flexion/extension.  The patient's greatest point of tenderness to palpation is in the valley between Gerdy's Tubercle and the lateral tibial plateau. -AROM/PROM: 5 degrees extension, 135 degrees flexion, normal  hamstring flexibility -Strength:  5/5 flexion, 5/5 extension -Special tests:    -ACL: - lachman, - lever test   -MCL: stable and painless with valgus at 0/30 degrees   -LCL: stable and painless with varus at 0/30 degrees   -PCL:  sag sign   -Meniscus: Equivocal thessaly, - McMurray   -Patellofemoral: - patellar grind    -PLC: Equivocal dial test.  Equivocal resisted tibial internal rotation  Limited ultrasound examination of the left knee: There appears to be a small fiber architecture disruption replaced by hypoechogenic fluid signal at the footprint of the IT band at [facility] tubercle There appears to be a prominent and disordered thickening of the synovial lining at the inferolateral portion of the knee joint with a questionable parameniscal cyst present adjacent to the lateral meniscus. Small hypoechogenic fluid signal present within the suprapatellar pouch after milking of the inferior portion of the knee joint. Tenderness to sono palpation within the [facility] between Gertie's tubercle and the lateral tibial plateau. Interpretation: Likely small tear and IT band at insertion point Possible small lateral meniscus parameniscal cyst Inferolateral knee joint synovitis     Assessment & Plan:   Assessment & Plan Left knee pain with partial tear and inflammation of left iliotibial band   Chronic left knee pain persists due to a partial tear and inflammation of the left iliotibial band, unresponsive to previous physical therapy and conservative management. Recent exacerbation includes swelling and inability to fully extend the knee. Differential diagnosis includes meniscal tear with possible meniscal cyst and IT band syndrome. Ultrasound shows a small tear in the IT band and inflammation in the lateral joint  area. The meniscus appears intact but with possible adjacent cyst formation. Pain may stem from meniscal involvement and IT band inflammation. Conservative management versus intervention with  injection or further imaging is under consideration. Risks of surgery, including increased arthritis risk, and benefits of conservative management were discussed. Apply Voltaren gel to reduce inflammation. Continue rehabilitation focusing on the IT band.  We will move forward with planning to provide a steroid injection around his IT band and obtain an MRI to fully evaluate intra-articular structures which were incompletely evaluated on my ultrasound today.   I spent greater than 60 summative minutes evaluating patient's prior imaging, evaluating the patient through physical exam, discussing with him the findings of my physical exam and ultrasound, reviewing potential treatment options, and discussing my recommended next steps for treatment with him over the phone at 6:56 PM.

## 2024-07-05 ENCOUNTER — Ambulatory Visit (HOSPITAL_BASED_OUTPATIENT_CLINIC_OR_DEPARTMENT_OTHER): Admission: RE | Admit: 2024-07-05 | Discharge: 2024-07-05

## 2024-07-05 DIAGNOSIS — M25469 Effusion, unspecified knee: Secondary | ICD-10-CM | POA: Diagnosis present

## 2024-07-05 DIAGNOSIS — M2392 Unspecified internal derangement of left knee: Secondary | ICD-10-CM | POA: Insufficient documentation

## 2024-07-05 DIAGNOSIS — M25562 Pain in left knee: Secondary | ICD-10-CM | POA: Insufficient documentation

## 2024-07-05 DIAGNOSIS — G8929 Other chronic pain: Secondary | ICD-10-CM | POA: Diagnosis present

## 2024-07-05 DIAGNOSIS — M7632 Iliotibial band syndrome, left leg: Secondary | ICD-10-CM | POA: Diagnosis present

## 2024-07-07 ENCOUNTER — Telehealth (INDEPENDENT_AMBULATORY_CARE_PROVIDER_SITE_OTHER)

## 2024-07-07 DIAGNOSIS — S83207D Unspecified tear of unspecified meniscus, current injury, left knee, subsequent encounter: Secondary | ICD-10-CM

## 2024-07-07 NOTE — Progress Notes (Addendum)
" ° ° °  Patient ID: Paul Rosario, male    DOB: 23 y.o., 09-13-2000   MRN: [id number]   Diagnosis: Acute medial meniscus tear Chief Complaint: Video Visit for MRI Result Review  Patient Location: [place], [place]  Provider Location: [place], [place]    Discussed the use of AI scribe software for clinical note transcription with the patient, who gave verbal consent to proceed.  History of Present Illness Is doing pretty well so far.  Hit the quads and hamstrings on leg day and felt pretty good actually.   Physical Exam  Const: appears well, non-toxic, well groomed Psych: affect bright, interactive, smiling EENT: EOMI intact, conjunctiva appear normal Neck: no obvious masses, appears symmetric Resp: non-labored, appears symmetric Neuro: muscle bulk appears normal Skin: no obvious rashes noted  Assessment & Plan Will start PT in [place], refer to surgery to discuss rehab, inject, or surgery.     "
